# Patient Record
Sex: Female | Born: 1945 | Race: White | Hispanic: No | Marital: Married | Smoking: Former smoker
Health system: Southern US, Community
[De-identification: ages and names within clinical notes are randomized; demographics above are authoritative.]

## PROBLEM LIST (undated history)

## (undated) DIAGNOSIS — G4733 Obstructive sleep apnea (adult) (pediatric): Secondary | ICD-10-CM

## (undated) DIAGNOSIS — F329 Major depressive disorder, single episode, unspecified: Secondary | ICD-10-CM

## (undated) DIAGNOSIS — Z860100 Personal history of colon polyps, unspecified: Secondary | ICD-10-CM

## (undated) DIAGNOSIS — F419 Anxiety disorder, unspecified: Secondary | ICD-10-CM

## (undated) DIAGNOSIS — E785 Hyperlipidemia, unspecified: Secondary | ICD-10-CM

## (undated) DIAGNOSIS — F32A Depression, unspecified: Secondary | ICD-10-CM

## (undated) DIAGNOSIS — R51 Headache: Secondary | ICD-10-CM

## (undated) DIAGNOSIS — G629 Polyneuropathy, unspecified: Secondary | ICD-10-CM

## (undated) DIAGNOSIS — M545 Low back pain: Secondary | ICD-10-CM

## (undated) DIAGNOSIS — N200 Calculus of kidney: Secondary | ICD-10-CM

## (undated) DIAGNOSIS — G8929 Other chronic pain: Secondary | ICD-10-CM

## (undated) DIAGNOSIS — G4700 Insomnia, unspecified: Secondary | ICD-10-CM

## (undated) DIAGNOSIS — J189 Pneumonia, unspecified organism: Secondary | ICD-10-CM

## (undated) DIAGNOSIS — K5900 Constipation, unspecified: Secondary | ICD-10-CM

## (undated) DIAGNOSIS — R519 Headache, unspecified: Secondary | ICD-10-CM

## (undated) DIAGNOSIS — Z9289 Personal history of other medical treatment: Secondary | ICD-10-CM

## (undated) DIAGNOSIS — I10 Essential (primary) hypertension: Secondary | ICD-10-CM

## (undated) DIAGNOSIS — Z9989 Dependence on other enabling machines and devices: Secondary | ICD-10-CM

## (undated) DIAGNOSIS — Z9889 Other specified postprocedural states: Secondary | ICD-10-CM

## (undated) DIAGNOSIS — R350 Frequency of micturition: Secondary | ICD-10-CM

## (undated) DIAGNOSIS — Z8709 Personal history of other diseases of the respiratory system: Secondary | ICD-10-CM

## (undated) DIAGNOSIS — R600 Localized edema: Secondary | ICD-10-CM

## (undated) DIAGNOSIS — C569 Malignant neoplasm of unspecified ovary: Secondary | ICD-10-CM

## (undated) DIAGNOSIS — K219 Gastro-esophageal reflux disease without esophagitis: Secondary | ICD-10-CM

## (undated) DIAGNOSIS — N390 Urinary tract infection, site not specified: Secondary | ICD-10-CM

## (undated) DIAGNOSIS — R609 Edema, unspecified: Secondary | ICD-10-CM

## (undated) DIAGNOSIS — R112 Nausea with vomiting, unspecified: Secondary | ICD-10-CM

## (undated) HISTORY — PX: CATARACT EXTRACTION W/ INTRAOCULAR LENS  IMPLANT, BILATERAL: SHX1307

## (undated) HISTORY — PX: COLONOSCOPY: SHX174

## (undated) HISTORY — PX: DIAGNOSTIC LAPAROSCOPY: SUR761

## (undated) HISTORY — PX: ABDOMINAL HYSTERECTOMY: SHX81

## (undated) HISTORY — PX: THYROID SURGERY: SHX805

---

## 1998-03-04 ENCOUNTER — Ambulatory Visit (HOSPITAL_COMMUNITY): Admission: RE | Admit: 1998-03-04 | Discharge: 1998-03-04 | Payer: Self-pay | Admitting: Internal Medicine

## 1999-03-28 ENCOUNTER — Encounter: Payer: Self-pay | Admitting: Internal Medicine

## 1999-03-28 ENCOUNTER — Ambulatory Visit (HOSPITAL_COMMUNITY): Admission: RE | Admit: 1999-03-28 | Discharge: 1999-03-28 | Payer: Self-pay | Admitting: Internal Medicine

## 1999-07-06 ENCOUNTER — Ambulatory Visit: Admission: RE | Admit: 1999-07-06 | Discharge: 1999-07-06 | Payer: Self-pay | Admitting: Unknown Physician Specialty

## 2000-03-29 ENCOUNTER — Encounter: Payer: Self-pay | Admitting: Internal Medicine

## 2000-03-29 ENCOUNTER — Ambulatory Visit (HOSPITAL_COMMUNITY): Admission: RE | Admit: 2000-03-29 | Discharge: 2000-03-29 | Payer: Self-pay | Admitting: Internal Medicine

## 2001-04-29 ENCOUNTER — Ambulatory Visit (HOSPITAL_COMMUNITY): Admission: RE | Admit: 2001-04-29 | Discharge: 2001-04-29 | Payer: Self-pay | Admitting: Internal Medicine

## 2001-04-29 ENCOUNTER — Encounter: Payer: Self-pay | Admitting: Internal Medicine

## 2001-04-30 ENCOUNTER — Encounter: Admission: RE | Admit: 2001-04-30 | Discharge: 2001-04-30 | Payer: Self-pay | Admitting: Internal Medicine

## 2001-04-30 ENCOUNTER — Encounter: Payer: Self-pay | Admitting: Internal Medicine

## 2002-05-01 ENCOUNTER — Ambulatory Visit (HOSPITAL_COMMUNITY): Admission: RE | Admit: 2002-05-01 | Discharge: 2002-05-01 | Payer: Self-pay | Admitting: Internal Medicine

## 2002-05-01 ENCOUNTER — Encounter: Payer: Self-pay | Admitting: Internal Medicine

## 2015-09-01 DIAGNOSIS — E042 Nontoxic multinodular goiter: Secondary | ICD-10-CM | POA: Insufficient documentation

## 2015-09-01 DIAGNOSIS — I10 Essential (primary) hypertension: Secondary | ICD-10-CM | POA: Insufficient documentation

## 2015-09-01 DIAGNOSIS — E785 Hyperlipidemia, unspecified: Secondary | ICD-10-CM | POA: Insufficient documentation

## 2015-09-01 DIAGNOSIS — M5136 Other intervertebral disc degeneration, lumbar region: Secondary | ICD-10-CM | POA: Insufficient documentation

## 2015-09-01 DIAGNOSIS — F411 Generalized anxiety disorder: Secondary | ICD-10-CM | POA: Insufficient documentation

## 2015-09-01 DIAGNOSIS — Z8543 Personal history of malignant neoplasm of ovary: Secondary | ICD-10-CM | POA: Insufficient documentation

## 2015-10-25 DIAGNOSIS — K219 Gastro-esophageal reflux disease without esophagitis: Secondary | ICD-10-CM | POA: Insufficient documentation

## 2015-10-25 DIAGNOSIS — D34 Benign neoplasm of thyroid gland: Secondary | ICD-10-CM | POA: Insufficient documentation

## 2015-11-01 ENCOUNTER — Other Ambulatory Visit: Payer: Self-pay | Admitting: Otolaryngology

## 2015-11-11 ENCOUNTER — Other Ambulatory Visit: Payer: Self-pay

## 2015-11-11 ENCOUNTER — Encounter (HOSPITAL_COMMUNITY)
Admission: RE | Admit: 2015-11-11 | Discharge: 2015-11-11 | Disposition: A | Discharge status: Home / Self Care | Payer: Medicare Other | Source: Ambulatory Visit | Attending: Otolaryngology | Admitting: Otolaryngology

## 2015-11-11 ENCOUNTER — Encounter (HOSPITAL_COMMUNITY): Payer: Self-pay

## 2015-11-11 ENCOUNTER — Ambulatory Visit (HOSPITAL_COMMUNITY)
Admission: RE | Admit: 2015-11-11 | Discharge: 2015-11-11 | Disposition: A | Discharge status: Home / Self Care | Payer: Medicare Other | Source: Ambulatory Visit | Attending: Anesthesiology | Admitting: Anesthesiology

## 2015-11-11 DIAGNOSIS — R001 Bradycardia, unspecified: Secondary | ICD-10-CM | POA: Insufficient documentation

## 2015-11-11 DIAGNOSIS — E079 Disorder of thyroid, unspecified: Secondary | ICD-10-CM | POA: Insufficient documentation

## 2015-11-11 DIAGNOSIS — Z01812 Encounter for preprocedural laboratory examination: Secondary | ICD-10-CM | POA: Diagnosis not present

## 2015-11-11 DIAGNOSIS — Z01818 Encounter for other preprocedural examination: Secondary | ICD-10-CM | POA: Diagnosis present

## 2015-11-11 DIAGNOSIS — I440 Atrioventricular block, first degree: Secondary | ICD-10-CM | POA: Insufficient documentation

## 2015-11-11 HISTORY — DX: Constipation, unspecified: K59.00

## 2015-11-11 HISTORY — DX: Anxiety disorder, unspecified: F41.9

## 2015-11-11 HISTORY — DX: Insomnia, unspecified: G47.00

## 2015-11-11 HISTORY — DX: Personal history of colon polyps, unspecified: Z86.0100

## 2015-11-11 HISTORY — DX: Urinary tract infection, site not specified: N39.0

## 2015-11-11 HISTORY — DX: Hyperlipidemia, unspecified: E78.5

## 2015-11-11 HISTORY — DX: Frequency of micturition: R35.0

## 2015-11-11 HISTORY — DX: Gastro-esophageal reflux disease without esophagitis: K21.9

## 2015-11-11 HISTORY — DX: Polyneuropathy, unspecified: G62.9

## 2015-11-11 HISTORY — DX: Personal history of other diseases of the respiratory system: Z87.09

## 2015-11-11 HISTORY — DX: Essential (primary) hypertension: I10

## 2015-11-11 HISTORY — DX: Personal history of other medical treatment: Z92.89

## 2015-11-11 HISTORY — DX: Nausea with vomiting, unspecified: Z98.890

## 2015-11-11 HISTORY — DX: Pneumonia, unspecified organism: J18.9

## 2015-11-11 HISTORY — DX: Personal history of colonic polyps: Z86.010

## 2015-11-11 HISTORY — DX: Edema, unspecified: R60.9

## 2015-11-11 HISTORY — DX: Depression, unspecified: F32.A

## 2015-11-11 HISTORY — DX: Major depressive disorder, single episode, unspecified: F32.9

## 2015-11-11 HISTORY — DX: Other specified postprocedural states: R11.2

## 2015-11-11 HISTORY — DX: Localized edema: R60.0

## 2015-11-11 LAB — CBC
HCT: 41.3 % (ref 36.0–46.0)
Hemoglobin: 13.7 g/dL (ref 12.0–15.0)
MCH: 29.3 pg (ref 26.0–34.0)
MCHC: 33.2 g/dL (ref 30.0–36.0)
MCV: 88.2 fL (ref 78.0–100.0)
PLATELETS: 175 10*3/uL (ref 150–400)
RBC: 4.68 MIL/uL (ref 3.87–5.11)
RDW: 13 % (ref 11.5–15.5)
WBC: 8.6 10*3/uL (ref 4.0–10.5)

## 2015-11-11 LAB — BASIC METABOLIC PANEL
Anion gap: 8 (ref 5–15)
BUN: 17 mg/dL (ref 6–20)
CO2: 25 mmol/L (ref 22–32)
CREATININE: 0.87 mg/dL (ref 0.44–1.00)
Calcium: 10.6 mg/dL — ABNORMAL HIGH (ref 8.9–10.3)
Chloride: 106 mmol/L (ref 101–111)
GFR calc Af Amer: >60 mL/min (ref >60)
GLUCOSE: 82 mg/dL (ref 65–99)
Potassium: 4.2 mmol/L (ref 3.5–5.1)
SODIUM: 139 mmol/L (ref 135–145)

## 2015-11-11 NOTE — Progress Notes (Addendum)
Cardiologist denies  Medical Md is Dr.Rebecca Suzy Bouchard  Echo around 2002  Stress test prior to 2002  Heart cath denies  EKG denies in past yr  CXR denies in past yr

## 2015-11-11 NOTE — Pre-Procedure Instructions (Signed)
Mary Vega  11/11/2015     No Pharmacies Listed   Your procedure is scheduled on Thurs, Mar 16 @ 7:30 AM  Report to [facility] Admitting at 5:30 AM  Call this number if you have problems the morning of surgery:  [phone]   Remember:  Do not eat food or drink liquids after midnight.  Take these medicines the morning of surgery with A SIP OF WATER: Omeprazole(Prilosec),Lorazepam(Ativan),and Sertraline(Zoloft)             No Goody's,BC's,Aleve,Aspirin,Ibuprofen,Motrin,Advil,Fish Oil,or any Herbal Medications.    Do not wear jewelry, make-up or nail polish.  Do not wear lotions, powders, or perfumes.  You may wear deodorant.  Do not shave 48 hours prior to surgery.    Do not bring valuables to the hospital.  [facility] is not responsible for any belongings or valuables.  Contacts, dentures or bridgework may not be worn into surgery.  Leave your suitcase in the car.  After surgery it may be brought to your room.  For patients admitted to the hospital, discharge time will be determined by your treatment team.  Patients discharged the day of surgery will not be allowed to drive home.    Special instructions:  [place] - Preparing for Surgery  Before surgery, you can play an important role.  Because skin is not sterile, your skin needs to be as free of germs as possible.  You can reduce the number of germs on you skin by washing with CHG (chlorahexidine gluconate) soap before surgery.  CHG is an antiseptic cleaner which kills germs and bonds with the skin to continue killing germs even after washing.  Please DO NOT use if you have an allergy to CHG or antibacterial soaps.  If your skin becomes reddened/irritated stop using the CHG and inform your nurse when you arrive at Short Stay.  Do not shave (including legs and underarms) for at least 48 hours prior to the first CHG shower.  You may shave your face.  Please follow these instructions carefully:   1.   Shower with CHG Soap the night before surgery and the                                morning of Surgery.  2.  If you choose to wash your hair, wash your hair first as usual with your       normal shampoo.  3.  After you shampoo, rinse your hair and body thoroughly to remove the                      Shampoo.  4.  Use CHG as you would any other liquid soap.  You can apply chg directly       to the skin and wash gently with scrungie or a clean washcloth.  5.  Apply the CHG Soap to your body ONLY FROM THE NECK DOWN.        Do not use on open wounds or open sores.  Avoid contact with your eyes,       ears, mouth and genitals (private parts).  Wash genitals (private parts)       with your normal soap.  6.  Wash thoroughly, paying special attention to the area where your surgery        will be performed.  7.  Thoroughly rinse your body with warm  water from the neck down.  8.  DO NOT shower/wash with your normal soap after using and rinsing off       the CHG Soap.  9.  Pat yourself dry with a clean towel.            10.  Wear clean pajamas.            11.  Place clean sheets on your bed the night of your first shower and do not        sleep with pets.  Day of Surgery  Do not apply any lotions/deoderants the morning of surgery.  Please wear clean clothes to the hospital/surgery center.    Please read over the following fact sheets that you were given. Pain Booklet, Coughing and Deep Breathing and Surgical Site Infection Prevention

## 2015-11-16 NOTE — Anesthesia Preprocedure Evaluation (Addendum)
Anesthesia Evaluation  Patient identified by MRN, date of birth, ID band Patient awake    Reviewed: Allergy & Precautions, NPO status , Patient's Chart, lab work & pertinent test results  History of Anesthesia Complications Negative for: history of anesthetic complications  Airway Mallampati: II  TM Distance: >3 FB Neck ROM: Full    Dental  (+) Edentulous Upper, Dental Advisory Given   Pulmonary former smoker,    breath sounds clear to auscultation       Cardiovascular hypertension, Pt. on medications (-) angina Rhythm:Regular Rate:Normal     Neuro/Psych  Headaches, Anxiety Depression    GI/Hepatic Neg liver ROS, GERD  Medicated and Controlled,  Endo/Other  Thyroid mass  Renal/GU negative Renal ROS     Musculoskeletal   Abdominal   Peds  Hematology negative hematology ROS (+)   Anesthesia Other Findings   Reproductive/Obstetrics H/o ovarian cancer: chemo                           Anesthesia Physical Anesthesia Plan  ASA: III  Anesthesia Plan: General   Post-op Pain Management:    Induction: Intravenous  Airway Management Planned: Oral ETT  Additional Equipment:   Intra-op Plan:   Post-operative Plan: Extubation in OR  Informed Consent: I have reviewed the patients History and Physical, chart, labs and discussed the procedure including the risks, benefits and alternatives for the proposed anesthesia with the patient or authorized representative who has indicated his/her understanding and acceptance.   Dental advisory given  Plan Discussed with: CRNA and Surgeon  Anesthesia Plan Comments: (Plan routine monitors, GETA)        Anesthesia Quick Evaluation

## 2015-11-17 ENCOUNTER — Ambulatory Visit (HOSPITAL_COMMUNITY): Payer: Medicare Other | Admitting: Anesthesiology

## 2015-11-17 ENCOUNTER — Observation Stay (HOSPITAL_COMMUNITY)
Admission: RE | Admit: 2015-11-17 | Discharge: 2015-11-19 | Disposition: A | Discharge status: Home / Self Care | Payer: Medicare Other | Source: Ambulatory Visit | Attending: Otolaryngology | Admitting: Otolaryngology

## 2015-11-17 ENCOUNTER — Encounter (HOSPITAL_COMMUNITY)
Admission: RE | Disposition: A | Discharge status: Home / Self Care | Payer: Self-pay | Source: Ambulatory Visit | Attending: Otolaryngology

## 2015-11-17 ENCOUNTER — Encounter (HOSPITAL_COMMUNITY): Payer: Self-pay | Admitting: *Deleted

## 2015-11-17 DIAGNOSIS — Z8543 Personal history of malignant neoplasm of ovary: Secondary | ICD-10-CM | POA: Insufficient documentation

## 2015-11-17 DIAGNOSIS — Z87891 Personal history of nicotine dependence: Secondary | ICD-10-CM | POA: Insufficient documentation

## 2015-11-17 DIAGNOSIS — R600 Localized edema: Secondary | ICD-10-CM | POA: Diagnosis not present

## 2015-11-17 DIAGNOSIS — F419 Anxiety disorder, unspecified: Secondary | ICD-10-CM | POA: Diagnosis not present

## 2015-11-17 DIAGNOSIS — E0789 Other specified disorders of thyroid: Secondary | ICD-10-CM | POA: Diagnosis present

## 2015-11-17 DIAGNOSIS — D34 Benign neoplasm of thyroid gland: Principal | ICD-10-CM | POA: Insufficient documentation

## 2015-11-17 DIAGNOSIS — Z79899 Other long term (current) drug therapy: Secondary | ICD-10-CM | POA: Diagnosis not present

## 2015-11-17 DIAGNOSIS — K5900 Constipation, unspecified: Secondary | ICD-10-CM | POA: Diagnosis not present

## 2015-11-17 DIAGNOSIS — G4700 Insomnia, unspecified: Secondary | ICD-10-CM | POA: Insufficient documentation

## 2015-11-17 DIAGNOSIS — K219 Gastro-esophageal reflux disease without esophagitis: Secondary | ICD-10-CM | POA: Diagnosis not present

## 2015-11-17 DIAGNOSIS — F329 Major depressive disorder, single episode, unspecified: Secondary | ICD-10-CM | POA: Insufficient documentation

## 2015-11-17 DIAGNOSIS — G8929 Other chronic pain: Secondary | ICD-10-CM | POA: Insufficient documentation

## 2015-11-17 DIAGNOSIS — E785 Hyperlipidemia, unspecified: Secondary | ICD-10-CM | POA: Insufficient documentation

## 2015-11-17 DIAGNOSIS — E079 Disorder of thyroid, unspecified: Secondary | ICD-10-CM | POA: Diagnosis present

## 2015-11-17 DIAGNOSIS — I10 Essential (primary) hypertension: Secondary | ICD-10-CM | POA: Diagnosis not present

## 2015-11-17 HISTORY — DX: Headache, unspecified: R51.9

## 2015-11-17 HISTORY — DX: Malignant neoplasm of unspecified ovary: C56.9

## 2015-11-17 HISTORY — DX: Calculus of kidney: N20.0

## 2015-11-17 HISTORY — DX: Obstructive sleep apnea (adult) (pediatric): G47.33

## 2015-11-17 HISTORY — DX: Low back pain: M54.5

## 2015-11-17 HISTORY — PX: THYROIDECTOMY: SHX17

## 2015-11-17 HISTORY — DX: Dependence on other enabling machines and devices: Z99.89

## 2015-11-17 HISTORY — PX: THYROID LOBECTOMY: SHX420

## 2015-11-17 HISTORY — DX: Other chronic pain: G89.29

## 2015-11-17 HISTORY — DX: Headache: R51

## 2015-11-17 LAB — CBC
HEMATOCRIT: 39.8 % (ref 36.0–46.0)
HEMOGLOBIN: 13.6 g/dL (ref 12.0–15.0)
MCH: 30.3 pg (ref 26.0–34.0)
MCHC: 34.2 g/dL (ref 30.0–36.0)
MCV: 88.6 fL (ref 78.0–100.0)
Platelets: 176 10*3/uL (ref 150–400)
RBC: 4.49 MIL/uL (ref 3.87–5.11)
RDW: 13 % (ref 11.5–15.5)
WBC: 19.6 10*3/uL — ABNORMAL HIGH (ref 4.0–10.5)

## 2015-11-17 LAB — CREATININE, SERUM
Creatinine, Ser: 0.94 mg/dL (ref 0.44–1.00)
GFR calc Af Amer: >60 mL/min (ref >60)
GFR calc non Af Amer: >60 mL/min (ref >60)

## 2015-11-17 SURGERY — THYROIDECTOMY
Anesthesia: General | Site: Neck | Laterality: Right

## 2015-11-17 MED ORDER — HEPARIN SODIUM (PORCINE) 5000 UNIT/ML IJ SOLN
5000.0000 [IU] | Freq: Three times a day (TID) | INTRAMUSCULAR | Status: DC
  Administered 2015-11-17 – 2015-11-19 (×5): 5000 [IU] via SUBCUTANEOUS
  Filled 2015-11-17 (×5): qty 1

## 2015-11-17 MED ORDER — ONDANSETRON HCL 4 MG/2ML IJ SOLN
4.0000 mg | Freq: Four times a day (QID) | INTRAMUSCULAR | Status: DC | PRN
  Administered 2015-11-17 – 2015-11-19 (×5): 4 mg via INTRAVENOUS
  Filled 2015-11-17 (×5): qty 2

## 2015-11-17 MED ORDER — LIDOCAINE-EPINEPHRINE 1 %-1:100000 IJ SOLN
INTRAMUSCULAR | Status: AC
  Filled 2015-11-17: qty 1

## 2015-11-17 MED ORDER — DOCUSATE SODIUM 100 MG PO CAPS
100.0000 mg | ORAL_CAPSULE | Freq: Every day | ORAL | Status: DC | PRN
  Administered 2015-11-18: 100 mg via ORAL
  Filled 2015-11-17: qty 1

## 2015-11-17 MED ORDER — MIDAZOLAM HCL 2 MG/2ML IJ SOLN: 0.5000 mg | Freq: Once | INTRAMUSCULAR | Status: DC | PRN

## 2015-11-17 MED ORDER — LACTATED RINGERS IV SOLN
INTRAVENOUS | Status: DC | PRN
  Administered 2015-11-17 (×2): via INTRAVENOUS

## 2015-11-17 MED ORDER — SUCCINYLCHOLINE 20MG/ML (10ML) SYRINGE FOR MEDFUSION PUMP - OPTIME
INTRAMUSCULAR | Status: DC | PRN
  Administered 2015-11-17: 100 mg via INTRAVENOUS

## 2015-11-17 MED ORDER — LORAZEPAM 1 MG PO TABS
1.0000 mg | ORAL_TABLET | Freq: Four times a day (QID) | ORAL | Status: DC | PRN
Start: 2015-11-17 — End: 2015-11-19
  Administered 2015-11-18 – 2015-11-19 (×4): 1 mg via ORAL
  Filled 2015-11-17 (×4): qty 1

## 2015-11-17 MED ORDER — PHENYLEPHRINE 40 MCG/ML (10ML) SYRINGE FOR IV PUSH (FOR BLOOD PRESSURE SUPPORT)
PREFILLED_SYRINGE | INTRAVENOUS | Status: AC
  Filled 2015-11-17: qty 10

## 2015-11-17 MED ORDER — MEPERIDINE HCL 25 MG/ML IJ SOLN: 6.2500 mg | INTRAMUSCULAR | Status: DC | PRN

## 2015-11-17 MED ORDER — SODIUM CHLORIDE 0.9 % IJ SOLN
INTRAMUSCULAR | Status: AC
  Filled 2015-11-17: qty 10

## 2015-11-17 MED ORDER — ONDANSETRON HCL 4 MG/2ML IJ SOLN
INTRAMUSCULAR | Status: DC | PRN
  Administered 2015-11-17: 4 mg via INTRAVENOUS

## 2015-11-17 MED ORDER — 0.9 % SODIUM CHLORIDE (POUR BTL) OPTIME
TOPICAL | Status: DC | PRN
  Administered 2015-11-17: 1000 mL

## 2015-11-17 MED ORDER — FENTANYL CITRATE (PF) 250 MCG/5ML IJ SOLN
INTRAMUSCULAR | Status: AC
  Filled 2015-11-17: qty 5

## 2015-11-17 MED ORDER — DEXTROSE-NACL 5-0.45 % IV SOLN
INTRAVENOUS | Status: DC
  Administered 2015-11-17 – 2015-11-18 (×4): via INTRAVENOUS

## 2015-11-17 MED ORDER — SPIRONOLACTONE 25 MG PO TABS
25.0000 mg | ORAL_TABLET | Freq: Every day | ORAL | Status: DC
  Administered 2015-11-18 – 2015-11-19 (×2): 25 mg via ORAL
  Filled 2015-11-17 (×2): qty 1

## 2015-11-17 MED ORDER — LOSARTAN POTASSIUM 50 MG PO TABS
100.0000 mg | ORAL_TABLET | Freq: Every day | ORAL | Status: DC
  Administered 2015-11-17 – 2015-11-18 (×2): 100 mg via ORAL
  Filled 2015-11-17 (×2): qty 2

## 2015-11-17 MED ORDER — LIDOCAINE HCL (CARDIAC) 20 MG/ML IV SOLN
INTRAVENOUS | Status: AC
  Filled 2015-11-17: qty 5

## 2015-11-17 MED ORDER — PROPOFOL 10 MG/ML IV BOLUS
INTRAVENOUS | Status: AC
  Filled 2015-11-17: qty 40

## 2015-11-17 MED ORDER — TRAZODONE HCL 100 MG PO TABS
200.0000 mg | ORAL_TABLET | Freq: Every day | ORAL | Status: DC
  Administered 2015-11-17 – 2015-11-18 (×2): 200 mg via ORAL
  Filled 2015-11-17 (×2): qty 2

## 2015-11-17 MED ORDER — ONDANSETRON 4 MG PO TBDP: 4.0000 mg | ORAL_TABLET | Freq: Four times a day (QID) | ORAL | Status: DC | PRN

## 2015-11-17 MED ORDER — HEMOSTATIC AGENTS (NO CHARGE) OPTIME
TOPICAL | Status: DC | PRN
  Administered 2015-11-17: 1 via TOPICAL

## 2015-11-17 MED ORDER — MIDAZOLAM HCL 2 MG/2ML IJ SOLN
INTRAMUSCULAR | Status: AC
  Filled 2015-11-17: qty 2

## 2015-11-17 MED ORDER — SERTRALINE HCL 100 MG PO TABS
100.0000 mg | ORAL_TABLET | Freq: Every day | ORAL | Status: DC
  Administered 2015-11-18 – 2015-11-19 (×2): 100 mg via ORAL
  Filled 2015-11-17 (×2): qty 1

## 2015-11-17 MED ORDER — HYDROMORPHONE HCL 1 MG/ML IJ SOLN
INTRAMUSCULAR | Status: AC
  Filled 2015-11-17: qty 1

## 2015-11-17 MED ORDER — PANTOPRAZOLE SODIUM 40 MG PO TBEC
40.0000 mg | DELAYED_RELEASE_TABLET | Freq: Every day | ORAL | Status: DC
  Administered 2015-11-18 – 2015-11-19 (×2): 40 mg via ORAL
  Filled 2015-11-17 (×2): qty 1

## 2015-11-17 MED ORDER — ARTIFICIAL TEARS OP OINT
TOPICAL_OINTMENT | OPHTHALMIC | Status: DC | PRN
  Administered 2015-11-17: 1 via OPHTHALMIC

## 2015-11-17 MED ORDER — MIDAZOLAM HCL 5 MG/5ML IJ SOLN
INTRAMUSCULAR | Status: DC | PRN
  Administered 2015-11-17 (×2): 1 mg via INTRAVENOUS

## 2015-11-17 MED ORDER — PROPOFOL 500 MG/50ML IV EMUL
INTRAVENOUS | Status: DC | PRN
  Administered 2015-11-17: 100 ug/kg/min via INTRAVENOUS

## 2015-11-17 MED ORDER — HYDROMORPHONE HCL 1 MG/ML IJ SOLN
0.2500 mg | INTRAMUSCULAR | Status: DC | PRN
  Administered 2015-11-17 (×2): 0.5 mg via INTRAVENOUS

## 2015-11-17 MED ORDER — NALOXONE HCL 0.4 MG/ML IJ SOLN
INTRAMUSCULAR | Status: AC
  Filled 2015-11-17: qty 1

## 2015-11-17 MED ORDER — SUCCINYLCHOLINE CHLORIDE 20 MG/ML IJ SOLN
INTRAMUSCULAR | Status: AC
  Filled 2015-11-17: qty 1

## 2015-11-17 MED ORDER — ARTIFICIAL TEARS OP OINT
TOPICAL_OINTMENT | OPHTHALMIC | Status: AC
  Filled 2015-11-17: qty 3.5

## 2015-11-17 MED ORDER — PROMETHAZINE HCL 25 MG/ML IJ SOLN: 6.2500 mg | INTRAMUSCULAR | Status: DC | PRN

## 2015-11-17 MED ORDER — SIMVASTATIN 40 MG PO TABS
40.0000 mg | ORAL_TABLET | Freq: Every day | ORAL | Status: DC
  Administered 2015-11-17 – 2015-11-18 (×2): 40 mg via ORAL
  Filled 2015-11-17 (×2): qty 1

## 2015-11-17 MED ORDER — EPHEDRINE SULFATE 50 MG/ML IJ SOLN
INTRAMUSCULAR | Status: AC
  Filled 2015-11-17: qty 1

## 2015-11-17 MED ORDER — HYDROCODONE-ACETAMINOPHEN 5-325 MG PO TABS
1.0000 | ORAL_TABLET | ORAL | Status: DC | PRN
  Administered 2015-11-17: 1 via ORAL
  Administered 2015-11-18 (×2): 2 via ORAL
  Administered 2015-11-18: 1 via ORAL
  Administered 2015-11-18: 2 via ORAL
  Filled 2015-11-17 (×2): qty 2
  Filled 2015-11-17: qty 1
  Filled 2015-11-17 (×2): qty 2

## 2015-11-17 MED ORDER — LIDOCAINE HCL (CARDIAC) 20 MG/ML IV SOLN
INTRAVENOUS | Status: DC | PRN
  Administered 2015-11-17: 20 mg via INTRAVENOUS
  Administered 2015-11-17: 80 mg via INTRAVENOUS

## 2015-11-17 MED ORDER — OMEGA-3-ACID ETHYL ESTERS 1 G PO CAPS
1.0000 g | ORAL_CAPSULE | Freq: Every day | ORAL | Status: DC
  Administered 2015-11-18 – 2015-11-19 (×2): 1 g via ORAL
  Filled 2015-11-17 (×2): qty 1

## 2015-11-17 MED ORDER — PHENYLEPHRINE HCL 10 MG/ML IJ SOLN
INTRAMUSCULAR | Status: DC | PRN
  Administered 2015-11-17: 80 ug via INTRAVENOUS

## 2015-11-17 MED ORDER — PROPOFOL 10 MG/ML IV BOLUS
INTRAVENOUS | Status: DC | PRN
  Administered 2015-11-17 (×2): 50 mg via INTRAVENOUS
  Administered 2015-11-17: 150 mg via INTRAVENOUS

## 2015-11-17 MED ORDER — GLYCOPYRROLATE 0.2 MG/ML IJ SOLN
INTRAMUSCULAR | Status: DC | PRN
  Administered 2015-11-17: 0.2 mg via INTRAVENOUS

## 2015-11-17 MED ORDER — ONDANSETRON HCL 4 MG/2ML IJ SOLN
INTRAMUSCULAR | Status: AC
  Filled 2015-11-17: qty 2

## 2015-11-17 MED ORDER — FENTANYL CITRATE (PF) 100 MCG/2ML IJ SOLN
INTRAMUSCULAR | Status: DC | PRN
  Administered 2015-11-17: 100 ug via INTRAVENOUS
  Administered 2015-11-17: 150 ug via INTRAVENOUS

## 2015-11-17 SURGICAL SUPPLY — 52 items
APPLIER CLIP 9.375 SM OPEN (CLIP) ×3
APR CLP SM 9.3 20 MLT OPN (CLIP) ×1
ATTRACTOMAT 16X20 MAGNETIC DRP (DRAPES) IMPLANT
BLADE SURG 15 STRL LF DISP TIS (BLADE) IMPLANT
BLADE SURG 15 STRL SS (BLADE)
BLADE SURG ROTATE 9660 (MISCELLANEOUS) IMPLANT
CANISTER SUCTION 2500CC (MISCELLANEOUS) ×3 IMPLANT
CLEANER TIP ELECTROSURG 2X2 (MISCELLANEOUS) ×3 IMPLANT
CLIP APPLIE 9.375 SM OPEN (CLIP) ×1 IMPLANT
CONT SPEC 4OZ CLIKSEAL STRL BL (MISCELLANEOUS) ×4 IMPLANT
CORDS BIPOLAR (ELECTRODE) ×3 IMPLANT
COVER SURGICAL LIGHT HANDLE (MISCELLANEOUS) ×3 IMPLANT
CRADLE DONUT ADULT HEAD (MISCELLANEOUS) ×2 IMPLANT
DRAIN JACKSON RD 7FR 3/32 (WOUND CARE) ×3 IMPLANT
DRAPE PROXIMA HALF (DRAPES) ×3 IMPLANT
ELECT COATED BLADE 2.86 ST (ELECTRODE) ×3 IMPLANT
ELECT REM PT RETURN 9FT ADLT (ELECTROSURGICAL) ×3
ELECTRODE REM PT RTRN 9FT ADLT (ELECTROSURGICAL) ×1 IMPLANT
EVACUATOR SILICONE 100CC (DRAIN) ×3 IMPLANT
GAUZE SPONGE 4X4 16PLY XRAY LF (GAUZE/BANDAGES/DRESSINGS) ×3 IMPLANT
GLOVE BIO SURGEON STRL SZ 6.5 (GLOVE) ×2 IMPLANT
GLOVE BIO SURGEONS STRL SZ 6.5 (GLOVE) ×2
GLOVE BIOGEL PI IND STRL 7.0 (GLOVE) IMPLANT
GLOVE BIOGEL PI INDICATOR 7.0 (GLOVE) ×4
GLOVE ECLIPSE 7.5 STRL STRAW (GLOVE) ×3 IMPLANT
GLOVE SURG SS PI 7.0 STRL IVOR (GLOVE) ×4 IMPLANT
GOWN STRL REUS W/ TWL LRG LVL3 (GOWN DISPOSABLE) ×2 IMPLANT
GOWN STRL REUS W/TWL LRG LVL3 (GOWN DISPOSABLE) ×12
HEMOSTAT SURGICEL 2X4 FIBR (HEMOSTASIS) ×2 IMPLANT
KIT BASIN OR (CUSTOM PROCEDURE TRAY) ×3 IMPLANT
KIT ROOM TURNOVER OR (KITS) ×3 IMPLANT
LIQUID BAND (GAUZE/BANDAGES/DRESSINGS) ×2 IMPLANT
LOCATOR NERVE 3 VOLT (DISPOSABLE) IMPLANT
NDL HYPO 25GX1X1/2 BEV (NEEDLE) IMPLANT
NEEDLE HYPO 25GX1X1/2 BEV (NEEDLE) IMPLANT
NS IRRIG 1000ML POUR BTL (IV SOLUTION) ×3 IMPLANT
PAD ARMBOARD 7.5X6 YLW CONV (MISCELLANEOUS) ×4 IMPLANT
PENCIL FOOT CONTROL (ELECTRODE) ×3 IMPLANT
PROBE NERVBE PRASS .33 (MISCELLANEOUS) ×3 IMPLANT
SHEARS HARMONIC 9CM CVD (BLADE) ×3 IMPLANT
SPONGE INTESTINAL PEANUT (DISPOSABLE) IMPLANT
STAPLER VISISTAT 35W (STAPLE) ×3 IMPLANT
SUT CHROMIC 4 0 PS 2 18 (SUTURE) ×8 IMPLANT
SUT ETHILON 3 0 PS 1 (SUTURE) ×3 IMPLANT
SUT SILK 2 0 (SUTURE) ×3
SUT SILK 2-0 18XBRD TIE 12 (SUTURE) ×1 IMPLANT
SUT SILK 4 0 (SUTURE)
SUT SILK 4-0 18XBRD TIE 12 (SUTURE) IMPLANT
TOWEL OR 17X24 6PK STRL BLUE (TOWEL DISPOSABLE) ×3 IMPLANT
TRAY ENT MC OR (CUSTOM PROCEDURE TRAY) ×3 IMPLANT
TRAY FOLEY CATH 16FR SILVER (SET/KITS/TRAYS/PACK) IMPLANT
TUBE ENDOTRAC EMG 7X10.2 (MISCELLANEOUS) ×2 IMPLANT

## 2015-11-17 NOTE — Anesthesia Procedure Notes (Signed)
Procedure Name: Intubation Date/Time: 11/17/2015 7:42 AM Performed by: Scheryl Darter Pre-anesthesia Checklist: Patient identified, Emergency Drugs available, Suction available, Patient being monitored and Timeout performed Patient Re-evaluated:Patient Re-evaluated prior to inductionOxygen Delivery Method: Circle system utilized Preoxygenation: Pre-oxygenation with 100% oxygen Intubation Type: IV induction Ventilation: Mask ventilation without difficulty Laryngoscope Size: Miller and 2 Grade View: Grade III Tube type: Oral (nims ett) Tube size: 7.0 mm Number of attempts: 1 Airway Equipment and Method: Stylet Placement Confirmation: ETT inserted through vocal cords under direct vision,  positive ETCO2 and breath sounds checked- equal and bilateral Tube secured with: Tape Dental Injury: Teeth and Oropharynx as per pre-operative assessment  Difficulty Due To: Difficulty was anticipated

## 2015-11-17 NOTE — H&P (Signed)
Mary Vega is an 70 y.o. female.   Chief Complaint: thyroid mass HPI: hx of thyroid surgery and now with right sided mass in the thyroid by [place].   Past Medical History  Diagnosis Date  . GERD (gastroesophageal reflux disease)     takes Omeprazole daily  . Hypertension     takes Losartan daily   . UTI (lower urinary tract infection)     being treated with Macrobid,completed today 11/11/15  . Insomnia     takes Trazodone nightly  . Depression     takes Zoloft daily  . Anxiety     takes Lorazepam daily  . Hyperlipidemia     takes Simvastatin daily  . Peripheral edema     takes Spironolactone daily  . Constipation     takes Stool Softener daily as needed  . PONV (postoperative nausea and vomiting)   . Pneumonia 5 yrs ago  . History of bronchitis     Sept 2016  . Headache     sinus  . Peripheral neuropathy ([place])   . Cancer ([place])     ovarian  . Chronic back pain     buldging disc  . History of colon polyps     benign  . Urinary frequency   . History of blood transfusion     no abnormal reaction noted    Past Surgical History  Procedure Laterality Date  . Abdominal hysterectomy    . Thyroid surgery      x 2(goiter removed)  . Diagnostic laparoscopy      to drain cyst on ovary  . Colonoscopy    . Cataract surgery Bilateral     History reviewed. No pertinent family history. Social History:  reports that she has quit smoking. She has never used smokeless tobacco. She reports that she does not drink alcohol or use illicit drugs.  Allergies:  Allergies  Allergen Reactions  . Iodine-131 Anaphylaxis and Itching  . Ivp Dye [Iodinated Diagnostic Agents] Anaphylaxis  . Sulfa Antibiotics Hives and Other (See Comments)    Septic?  . Sulfamethoxazole Hives and Rash  . Clams [Shellfish Allergy] Diarrhea and Nausea And Vomiting  . Phenobarbital Other (See Comments)    Tremor (intolerance)    Medications Prior to Admission  Medication Sig Dispense Refill  . docusate  sodium (COLACE) 100 MG capsule Take 100 mg by mouth daily as needed for mild constipation.    Marland Kitchen LORazepam (ATIVAN) 1 MG tablet Take 1 mg by mouth every 6 (six) hours as needed for anxiety.    Marland Kitchen losartan (COZAAR) 100 MG tablet Take 100 mg by mouth daily.    . Multiple Vitamin (MULTIVITAMIN WITH MINERALS) TABS tablet Take 1 tablet by mouth daily.    Marland Kitchen omega-3 acid ethyl esters (LOVAZA) 1 g capsule Take 1 g by mouth daily.    Marland Kitchen omeprazole (PRILOSEC) 40 MG capsule Take 40 mg by mouth daily.    . sertraline (ZOLOFT) 100 MG tablet Take 100 mg by mouth daily.    . simvastatin (ZOCOR) 40 MG tablet Take 40 mg by mouth daily.    Marland Kitchen spironolactone (ALDACTONE) 25 MG tablet Take 25 mg by mouth daily.    . traZODone (DESYREL) 100 MG tablet Take 200 mg by mouth at bedtime.      No results found for this or any previous visit (from the past 48 hour(s)). No results found.  Review of Systems  Constitutional: Negative.   HENT: Negative.   Eyes: Negative.  Respiratory: Negative.   Cardiovascular: Negative.   Skin: Negative.     Blood pressure 119/66, pulse 56, temperature 97.8 F (36.6 C), temperature source Oral, resp. rate 18, weight 81.599 kg (179 lb 14.3 oz), SpO2 96 %. Physical Exam  Constitutional: She appears well-developed and well-nourished.  HENT:  Head: Normocephalic and atraumatic.  Nose: Nose normal.  Mouth/Throat: Oropharynx is clear and moist.  Eyes: Conjunctivae are normal. Pupils are equal, round, and reactive to light.  Neck: Normal range of motion. Neck supple.  Cardiovascular: Normal rate.   Respiratory: Effort normal.  GI: Soft.  Musculoskeletal: Normal range of motion.     Assessment/Plan Thyroid mass- discussed the -procedure and she is ready to proceed  Melissa Montane, MD 11/17/2015, 7:08 AM

## 2015-11-17 NOTE — Progress Notes (Signed)
Dr Jenita Seashore fully updated. No new orders

## 2015-11-17 NOTE — Progress Notes (Signed)
After additional 0.5mg  IV Dilaudid (total 1 mg), pt very sleepy with O2 sats 85-87 on [place]. Put back on 7L simple mask. She is easily arousable, uses IS up to 1000cc, but goes right back to sleep when left alone. Will cont to monitor.

## 2015-11-17 NOTE — Transfer of Care (Signed)
Immediate Anesthesia Transfer of Care Note  Patient: Mary Vega  Procedure(s) Performed: Procedure(s): RIGHT THYROID LOBECTOMY WITH FROZEN SECTION (Right)  Patient Location: PACU  Anesthesia Type:General  Level of Consciousness: awake, alert , oriented and sedated  Airway & Oxygen Therapy: Patient Spontanous Breathing and Patient connected to face mask oxygen  Post-op Assessment: Report given to RN, Post -op Vital signs reviewed and stable and Patient moving all extremities  Post vital signs: Reviewed and stable  Last Vitals:  Filed Vitals:   11/17/15 0618  BP: 119/66  Pulse: 56  Temp: 36.6 C  Resp: 18    Complications: No apparent anesthesia complications

## 2015-11-17 NOTE — Progress Notes (Signed)
Pt increasingly somonulent and difficult to arouse, O2 sat 95% ETCO2 63. Nasal airway placed.  Dr Glennon Mac paged and at bedside. Pt put on CPAP 8, FIO2 40 % by RT. She became more awake,responsive.  Will cont to monitor closely.

## 2015-11-17 NOTE — Op Note (Signed)
Re-op/postop diagnosis: Thyroid mass right side Procedure: Right thyroidectomy with Nims monitor Anesthesia: Gen. Estimated blood loss: Less than 25 mL Indications: 70 year old who's had previous thyroidectomy surgery 2 in other states. It's unclear what the diagnosis is were for these operations. 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 She recently had a thyroid ultrasound performed which showed thyroid glands on both sides and and isthmus despite this surgical intervention. There was a 2.5 cm mass in the right gland which she wants to have removed. We discussed the procedure. We discussed risks, benefits, and options. All her questions are answered and consent was obtained. Procedure: Patient was taken to the operating room placed in the supine position after general endotracheal tube anesthesia with the Nims to monitor was prepped and draped in the usual sterile manner. The previous incision was used opening with the electrocautery. A superior and inferior subplatysmal flap was elevated. The retaining retractor was positioned and the midline was divided. The strap muscles were then defined and divided at their diastases. Immediately the isthmus of the thyroid was identified. Dissection was carried along the capsule of this thyroid gland which was scarred. Dissection was carefully brought lateral around the gland staying on the capsule. Inferiorly the what appeared to be the parathyroid gland was identified and dissected off the gland. Once this was dissected careful dissection was performed in this location and the nerve was identified. It was followed up into the insertion into the larynx. Superior pole was dissected along the capsule. There was several nodules identified within the thyroid gland. Berry's ligament was then divided after done a fine the insertion of the nerve. The gland was brought up to the isthmus and the isthmus was divided with electrocautery. The wound was irrigated with saline.  The nerve was stimulated multiple times to the surgery with the Nims monitor indicating good action potentials. At the end of the case this was also performed again with good action potentials. The wound was irrigated again and a drain was placed brought out the left lateral skin the. The strap muscles were sutured in the midline loosely with multiple interrupted chromics. The subcutaneous tissue was closed with interrupted 4-0 chromic and the drain was secured with 3-0 nylon. The frozen section was a follicular lesion but no evidence of malignancy. Dermabond was placed on the skin and the patient was awakened brought to recovery room in stable condition counts correct

## 2015-11-17 NOTE — Progress Notes (Signed)
Dr Glennon Mac fully updated-OK to tx pt to 6N.

## 2015-11-17 NOTE — Progress Notes (Signed)
Patient ID: Mary Vega, female   DOB: Oct 09, 1945, 70 y.o.   MRN: [id number]  Awake and alert. Complains of sore throat and has some nausea and vomiting earlier.   Breathing clearly, voice is normal. Neck incision excellent. JP holding a seal.  Stable post op. Continue overnight observation.

## 2015-11-17 NOTE — Anesthesia Postprocedure Evaluation (Signed)
Anesthesia Post Note  Patient: Kynesha A [place]port  Procedure(s) Performed: Procedure(s) (LRB): RIGHT THYROID LOBECTOMY WITH FROZEN SECTION (Right)  Patient location during evaluation: PACU Anesthesia Type: General Level of consciousness: awake and alert, oriented and patient cooperative Pain management: pain level controlled Vital Signs Assessment: post-procedure vital signs reviewed and stable Respiratory status: spontaneous breathing, nonlabored ventilation, respiratory function stable and patient connected to nasal cannula oxygen Cardiovascular status: blood pressure returned to baseline and stable Postop Assessment: no signs of nausea or vomiting Anesthetic complications: no    Last Vitals:  Filed Vitals:   11/17/15 1030 11/17/15 1045  BP: 189/76 157/75  Pulse: 81 79  Temp:    Resp: 21 21    Last Pain:  Filed Vitals:   11/17/15 1046  PainSc: 5                  Destany Severns,E. Harper Smoker

## 2015-11-18 ENCOUNTER — Encounter (HOSPITAL_COMMUNITY): Payer: Self-pay | Admitting: Otolaryngology

## 2015-11-18 DIAGNOSIS — D34 Benign neoplasm of thyroid gland: Secondary | ICD-10-CM | POA: Diagnosis not present

## 2015-11-18 LAB — BASIC METABOLIC PANEL
ANION GAP: 7 (ref 5–15)
BUN: 11 mg/dL (ref 6–20)
CHLORIDE: 105 mmol/L (ref 101–111)
CO2: 28 mmol/L (ref 22–32)
Calcium: 8.9 mg/dL (ref 8.9–10.3)
Creatinine, Ser: 0.88 mg/dL (ref 0.44–1.00)
GFR calc Af Amer: >60 mL/min (ref >60)
GLUCOSE: 140 mg/dL — AB (ref 65–99)
POTASSIUM: 3.7 mmol/L (ref 3.5–5.1)
Sodium: 140 mmol/L (ref 135–145)

## 2015-11-18 LAB — TSH: TSH: 0.252 u[IU]/mL — ABNORMAL LOW (ref 0.350–4.500)

## 2015-11-18 MED ORDER — PROMETHAZINE HCL 25 MG/ML IJ SOLN: 12.5000 mg | INTRAMUSCULAR | Status: DC | PRN

## 2015-11-18 MED ORDER — PROMETHAZINE HCL 25 MG/ML IJ SOLN
12.5000 mg | Freq: Four times a day (QID) | INTRAMUSCULAR | Status: DC | PRN
  Administered 2015-11-18: 12.5 mg via INTRAVENOUS
  Filled 2015-11-18: qty 1

## 2015-11-18 NOTE — Progress Notes (Signed)
Nausea/vomiting unrelieved by 2 doses of Zofran. Call placed to Dr. Janace Hoard' office. Await return call.

## 2015-11-18 NOTE — Discharge Summary (Signed)
Physician Discharge Summary  Patient ID: Mary Vega MRN: [id number] DOB/AGE: 03-Mar-1946 70 y.o.  Admit date: 11/17/2015 Discharge date: 11/18/2015  Admission Diagnoses:thyropid mass  Discharge Diagnoses: same Active Problems:   Thyroid mass   Discharged Condition: good  Hospital Course: hx of thyroid mass in past and now right sided nodule. She had right thyroidectomy and follicular lesion as frozen. She had some vomiting yesterday but no nausea today. Pain completely controlled. She wants to go home. No muscle twitching or cramps which would not be expected with this operation but she has had 2 thyroid surgeries before. She has excellent wound and strong voice. F/u in 1 week.   Consults: None  Significant Diagnostic Studies: none  Treatments: right thyroidectomy  Discharge Exam: Blood pressure 121/61, pulse 60, temperature 98.2 F (36.8 C), temperature source Oral, resp. rate 18, weight 81.599 kg (179 lb 14.3 oz), SpO2 94 %. alert awake. strong voice. wound looks excellent and drain removed. cv- rrrr l- clear abd- soft ext- no swelling or tenderness  Disposition: Final discharge disposition not confirmed  Discharge Instructions    Call MD for:  difficulty breathing, headache or visual disturbances    Complete by:  As directed      Call MD for:  extreme fatigue    Complete by:  As directed      Call MD for:  hives    Complete by:  As directed      Call MD for:  persistant dizziness or light-headedness    Complete by:  As directed      Call MD for:  persistant nausea and vomiting    Complete by:  As directed      Call MD for:  redness, tenderness, or signs of infection (pain, swelling, redness, odor or green/yellow discharge around incision site)    Complete by:  As directed      Call MD for:  severe uncontrolled pain    Complete by:  As directed      Call MD for:  temperature >100.4    Complete by:  As directed      Diet - low sodium heart healthy    Complete by:   As directed      Discharge instructions    Complete by:  As directed   Call for appointment in one week. Call if any muscle twitching,muscle cramps, numbness, or spasms of face or hand. Can get wound wet but no creams or products on the wound. Normal diet and call if any trouble.     Increase activity slowly    Complete by:  As directed             Medication List    TAKE these medications        docusate sodium 100 MG capsule  Commonly known as:  COLACE  Take 100 mg by mouth daily as needed for mild constipation.     LORazepam 1 MG tablet  Commonly known as:  ATIVAN  Take 1 mg by mouth every 6 (six) hours as needed for anxiety.     losartan 100 MG tablet  Commonly known as:  COZAAR  Take 100 mg by mouth daily.     multivitamin with minerals Tabs tablet  Take 1 tablet by mouth daily.     omega-3 acid ethyl esters 1 g capsule  Commonly known as:  LOVAZA  Take 1 g by mouth daily.     omeprazole 40 MG capsule  Commonly known as:  PRILOSEC  Take 40 mg by mouth daily.     sertraline 100 MG tablet  Commonly known as:  ZOLOFT  Take 100 mg by mouth daily.     simvastatin 40 MG tablet  Commonly known as:  ZOCOR  Take 40 mg by mouth daily.     spironolactone 25 MG tablet  Commonly known as:  ALDACTONE  Take 25 mg by mouth daily.     traZODone 100 MG tablet  Commonly known as:  DESYREL  Take 200 mg by mouth at bedtime.         SignedMelissa Montane 11/18/2015, 8:10 AM

## 2015-11-18 NOTE — Care Management Obs Status (Signed)
[place] NOTIFICATION   Patient Details  Name: Mary Vega MRN: [id number] Date of Birth: 12-Sep-1945   Medicare Observation Status Notification Given:  Yes (Medicare observation procedure)    Marilu Favre, RN 11/18/2015, 3:16 PM

## 2015-11-19 DIAGNOSIS — D34 Benign neoplasm of thyroid gland: Secondary | ICD-10-CM | POA: Diagnosis not present

## 2015-11-19 LAB — CALCIUM, IONIZED: CALCIUM, IONIZED, SERUM: 5 mg/dL (ref 4.5–5.6)

## 2015-11-19 MED ORDER — HYDROCODONE-ACETAMINOPHEN 5-325 MG PO TABS: 1.0000 | ORAL_TABLET | ORAL | Status: DC | PRN

## 2015-11-19 MED ORDER — PROMETHAZINE HCL 12.5 MG PO TABS: 12.5000 mg | ORAL_TABLET | ORAL | Status: DC | PRN

## 2015-11-19 NOTE — Discharge Summary (Signed)
11/19/2015 11:43 AM  Bradshaw, Ayeza [id number]  Post-Op Day 2 Discharge summary    Temp:  [97.8 F (36.6 C)-98.9 F (37.2 C)] 98.7 F (37.1 C) (03/18 0550) Pulse Rate:  [60-67] 67 (03/18 0550) Resp:  [17-18] 17 (03/18 0550) BP: (145-153)/(61-72) 153/61 mmHg (03/18 0550) SpO2:  [92 %-96 %] 96 % (03/18 0550),     Intake/Output Summary (Last 24 hours) at 11/19/15 1143 Last data filed at 11/18/15 1414  Gross per 24 hour  Intake    120 ml  Output      0 ml  Net    120 ml   N,V improved on Phenergan  Results for orders placed or performed during the hospital encounter of 11/17/15 (from the past 24 hour(s))  Basic metabolic panel     Status: Abnormal   Collection Time: 11/18/15  4:35 PM  Result Value Ref Range   Sodium 140 135 - 145 mmol/L   Potassium 3.7 3.5 - 5.1 mmol/L   Chloride 105 101 - 111 mmol/L   CO2 28 22 - 32 mmol/L   Glucose, Bld 140 (H) 65 - 99 mg/dL   BUN 11 6 - 20 mg/dL   Creatinine, Ser 0.88 0.44 - 1.00 mg/dL   Calcium 8.9 8.9 - 10.3 mg/dL   GFR calc non Af Amer >60 >60 mL/min   GFR calc Af Amer >60 >60 mL/min   Anion gap 7 5 - 15  TSH     Status: Abnormal   Collection Time: 11/18/15  4:35 PM  Result Value Ref Range   TSH 0.252 (L) 0.350 - 4.500 uIU/mL    SUBJECTIVE:  Mod pain, relieved by meds.  N,V better, but still reluctant to take anything more than water or ice cream.  OBJECTIVE:  Neck flat.  Voice clear.  Respirations good.  IMPRESSION:  Satisfactory post op check with controlled N,V  PLAN:  Discharge home.    Admit:  16 MAR D/C:  18 MAR Final diagnosis: thyroid mass Proc:  Thyroid lobectomy, 16 MAR Comp: prolonged N,V Cond: ambulatory, taking po fluids Rx's: Hydrocodone 5 mg, Phenergan, 12,5 mg Instructions written and given Recheck:  Dr. Serita Grit Course:  Underwent uneventful surgery.  Observed 23 hr and prepared for discharge.  Drain removed.  Developed N,V refractory to Ondansetron.  Controlled with Phenergan.  Breathing well.   No twitching or tingling.   Discharged home on POD 2.    Mary Vega

## 2016-07-31 DIAGNOSIS — I493 Ventricular premature depolarization: Secondary | ICD-10-CM | POA: Insufficient documentation

## 2016-11-11 ENCOUNTER — Ambulatory Visit
Admission: EM | Admit: 2016-11-11 | Discharge: 2016-11-11 | Disposition: A | Discharge status: Home / Self Care | Payer: Medicare Other | Attending: Family Medicine | Admitting: Family Medicine

## 2016-11-11 ENCOUNTER — Encounter: Payer: Self-pay | Admitting: Emergency Medicine

## 2016-11-11 DIAGNOSIS — J0100 Acute maxillary sinusitis, unspecified: Secondary | ICD-10-CM

## 2016-11-11 MED ORDER — AMOXICILLIN 875 MG PO TABS: 875.0000 mg | ORAL_TABLET | Freq: Two times a day (BID) | ORAL | 0 refills | Status: DC

## 2016-11-11 NOTE — ED Provider Notes (Signed)
[facility]    CSN: [id number] Arrival date & time: 11/11/16  1421     History   Chief Complaint Chief Complaint  Patient presents with  . Cough    HPI Mary Vega is a 71 y.o. female.   The history is provided by the patient.  Cough  Associated symptoms: fever and headaches   URI  Presenting symptoms: congestion, cough, facial pain and fever   Severity:  Moderate Onset quality:  Sudden Duration:  2 weeks Timing:  Constant Progression:  Worsening Chronicity:  New Relieved by:  Nothing Ineffective treatments:  OTC medications Associated symptoms: headaches and sinus pain   Risk factors: being elderly and sick contacts   Risk factors: no chronic cardiac disease, no chronic kidney disease, no chronic respiratory disease, no diabetes mellitus, no immunosuppression, no recent illness and no recent travel     Past Medical History:  Diagnosis Date  . Anxiety    takes Lorazepam daily  . Chronic lower back pain    buldging disc  . Constipation    takes Stool Softener daily as needed  . Depression    takes Zoloft daily  . GERD (gastroesophageal reflux disease)    takes Omeprazole daily  . History of blood transfusion    "when I had cancer surgery; hysterectomy"  . History of bronchitis    Sept 2016  . History of colon polyps    benign  . Hyperlipidemia    takes Simvastatin daily  . Hypertension    takes Losartan daily   . Insomnia    takes Trazodone nightly  . Kidney stone    "must have passed it"  . OSA on CPAP   . Ovarian cancer ([place])   . Peripheral edema    takes Spironolactone daily  . Peripheral neuropathy ([place])   . Pneumonia ~ 2012  . PONV (postoperative nausea and vomiting)   . Sinus headache   . Urinary frequency   . UTI (lower urinary tract infection)    being treated with Macrobid,completed today 11/11/15    Patient Active Problem List   Diagnosis Date Noted  . Thyroid mass 11/17/2015    Past Surgical History:  Procedure  Laterality Date  . ABDOMINAL HYSTERECTOMY    . CATARACT EXTRACTION W/ INTRAOCULAR LENS  IMPLANT, BILATERAL Bilateral   . COLONOSCOPY    . DIAGNOSTIC LAPAROSCOPY     to drain cyst on ovary  . THYROID LOBECTOMY Right 11/17/2015  . THYROID SURGERY  X 2   goiter removed  . THYROIDECTOMY Right 11/17/2015   Procedure: RIGHT THYROID LOBECTOMY WITH FROZEN SECTION;  Surgeon: Melissa Montane, MD;  Location: [facility];  Service: ENT;  Laterality: Right;    OB History    No data available       Home Medications    Prior to Admission medications   Medication Sig Start Date End Date Taking? Authorizing Provider  amoxicillin (AMOXIL) 875 MG tablet Take 1 tablet (875 mg total) by mouth 2 (two) times daily. 11/11/16   Norval Gable, MD  docusate sodium (COLACE) 100 MG capsule Take 100 mg by mouth daily as needed for mild constipation.    Historical Provider, MD  HYDROcodone-acetaminophen (NORCO/VICODIN) 5-325 MG tablet Take 1-2 tablets by mouth every 4 (four) hours as needed for moderate pain. 2/42/68   Jodi Marble, MD  LORazepam (ATIVAN) 1 MG tablet Take 1 mg by mouth every 6 (six) hours as needed for anxiety.    Historical Provider, MD  losartan (  COZAAR) 100 MG tablet Take 100 mg by mouth daily.    Historical Provider, MD  Multiple Vitamin (MULTIVITAMIN WITH MINERALS) TABS tablet Take 1 tablet by mouth daily.    Historical Provider, MD  omega-3 acid ethyl esters (LOVAZA) 1 g capsule Take 1 g by mouth daily.    Historical Provider, MD  omeprazole (PRILOSEC) 40 MG capsule Take 40 mg by mouth daily.    Historical Provider, MD  promethazine (PHENERGAN) 12.5 MG tablet Take 1 tablet (12.5 mg total) by mouth every 4 (four) hours as needed for nausea or vomiting. 5/95/63   Jodi Marble, MD  sertraline (ZOLOFT) 100 MG tablet Take 100 mg by mouth daily.    Historical Provider, MD  simvastatin (ZOCOR) 40 MG tablet Take 40 mg by mouth daily.    Historical Provider, MD  spironolactone (ALDACTONE) 25 MG tablet Take 25  mg by mouth daily.    Historical Provider, MD  traZODone (DESYREL) 100 MG tablet Take 200 mg by mouth at bedtime.    Historical Provider, MD    Family History History reviewed. No pertinent family history.  Social History Social History  Substance Use Topics  . Smoking status: Former Smoker    Packs/day: 0.50    Years: 47.00    Types: Cigarettes  . Smokeless tobacco: Never Used     Comment: quit smoking in ~ 2012  . Alcohol use No     Allergies   Iodine-131; Ivp dye [iodinated diagnostic agents]; Sulfa antibiotics; Sulfamethoxazole; Clams [shellfish allergy]; and Phenobarbital   Review of Systems Review of Systems  Constitutional: Positive for fever.  HENT: Positive for congestion and sinus pain.   Respiratory: Positive for cough.   Neurological: Positive for headaches.     Physical Exam Triage Vital Signs ED Triage Vitals  Enc Vitals Group     BP 11/11/16 1449 (!) 156/88     Pulse Rate 11/11/16 1449 63     Resp 11/11/16 1449 16     Temp 11/11/16 1449 98.4 F (36.9 C)     Temp Source 11/11/16 1449 Oral     SpO2 11/11/16 1449 100 %     Weight 11/11/16 1448 180 lb (81.6 kg)     Height 11/11/16 1448 5\' 5"  (1.651 m)     Head Circumference --      Peak Flow --      Pain Score 11/11/16 1448 6     Pain Loc --      Pain Edu? --      Excl. in [place]? --    No data found.   Updated Vital Signs BP (!) 156/88 (BP Location: Left Arm)   Pulse 63   Temp 98.4 F (36.9 C) (Oral)   Resp 16   Ht 5\' 5"  (1.651 m)   Wt 180 lb (81.6 kg)   SpO2 100%   BMI 29.95 kg/m   Visual Acuity Right Eye Distance:   Left Eye Distance:   Bilateral Distance:    Right Eye Near:   Left Eye Near:    Bilateral Near:     Physical Exam  Constitutional: She appears well-developed and well-nourished. No distress.  HENT:  Head: Normocephalic and atraumatic.  Right Ear: Tympanic membrane, external ear and ear canal normal.  Left Ear: Tympanic membrane, external ear and ear canal normal.    Nose: Mucosal edema and rhinorrhea present. No nose lacerations, sinus tenderness, nasal deformity, septal deviation or nasal septal hematoma. No epistaxis.  No foreign bodies. Right sinus  exhibits maxillary sinus tenderness and frontal sinus tenderness. Left sinus exhibits maxillary sinus tenderness and frontal sinus tenderness.  Mouth/Throat: Uvula is midline, oropharynx is clear and moist and mucous membranes are normal. No oropharyngeal exudate.  Eyes: Conjunctivae and EOM are normal. Pupils are equal, round, and reactive to light. Right eye exhibits no discharge. Left eye exhibits no discharge. No scleral icterus.  Neck: Normal range of motion. Neck supple. No thyromegaly present.  Cardiovascular: Normal rate, regular rhythm and normal heart sounds.   Pulmonary/Chest: Effort normal and breath sounds normal. No respiratory distress. She has no wheezes. She has no rales.  Lymphadenopathy:    She has no cervical adenopathy.  Skin: She is not diaphoretic.  Nursing note and vitals reviewed.    UC Treatments / Results  Labs (all labs ordered are listed, but only abnormal results are displayed) Labs Reviewed - No data to display  EKG  EKG Interpretation None       Radiology No results found.  Procedures Procedures (including critical care time)  Medications Ordered in UC Medications - No data to display   Initial Impression / Assessment and Plan / UC Course  I have reviewed the triage vital signs and the nursing notes.  Pertinent labs & imaging results that were available during my care of the patient were reviewed by me and considered in my medical decision making (see chart for details).       Final Clinical Impressions(s) / UC Diagnoses   Final diagnoses:  Acute maxillary sinusitis, recurrence not specified    New Prescriptions Discharge Medication List as of 11/11/2016  4:54 PM    START taking these medications   Details  amoxicillin (AMOXIL) 875 MG tablet  Take 1 tablet (875 mg total) by mouth 2 (two) times daily., Starting Sun 11/11/2016, Normal       1. diagnosis reviewed with patient 2. rx as per orders above; reviewed possible side effects, interactions, risks and benefits  3. Recommend supportive treatment with otc flonase 4. Follow-up prn if symptoms worsen or don't improve   Norval Gable, MD 11/11/16 1659

## 2016-11-11 NOTE — ED Triage Notes (Signed)
Patient c/o cough and chest congestion that started Feb. 28.  Patient reports HAs, sinus congestion and pressure that has gotten worse today.

## 2017-04-07 ENCOUNTER — Ambulatory Visit
Admission: EM | Admit: 2017-04-07 | Discharge: 2017-04-07 | Disposition: A | Discharge status: Home / Self Care | Payer: Medicare Other | Attending: Family Medicine | Admitting: Family Medicine

## 2017-04-07 ENCOUNTER — Encounter: Payer: Self-pay | Admitting: Gynecology

## 2017-04-07 DIAGNOSIS — K648 Other hemorrhoids: Secondary | ICD-10-CM | POA: Diagnosis not present

## 2017-04-07 DIAGNOSIS — B372 Candidiasis of skin and nail: Secondary | ICD-10-CM | POA: Diagnosis not present

## 2017-04-07 DIAGNOSIS — K644 Residual hemorrhoidal skin tags: Secondary | ICD-10-CM

## 2017-04-07 MED ORDER — FLUCONAZOLE 150 MG PO TABS: 150.0000 mg | ORAL_TABLET | Freq: Every day | ORAL | 0 refills | Status: DC

## 2017-04-07 MED ORDER — HYDROCORTISONE 2.5 % RE CREA: TOPICAL_CREAM | RECTAL | 0 refills | Status: DC

## 2017-04-07 NOTE — ED Provider Notes (Signed)
[facility]    CSN: [id number] Arrival date & time: 04/07/17  1328     History   Chief Complaint Chief Complaint  Patient presents with  . Hemorrhoids    HPI Mary Vega is a 71 y.o. female.   71 yo female with a c/o anal discomfort, itching and minimal blood streaks on toilet paper for the past 2-3 days. Denies any constipation, diarrhea, fevers, chills.    The history is provided by the patient.    Past Medical History:  Diagnosis Date  . Anxiety    takes Lorazepam daily  . Chronic lower back pain    buldging disc  . Constipation    takes Stool Softener daily as needed  . Depression    takes Zoloft daily  . GERD (gastroesophageal reflux disease)    takes Omeprazole daily  . History of blood transfusion    "when I had cancer surgery; hysterectomy"  . History of bronchitis    Sept 2016  . History of colon polyps    benign  . Hyperlipidemia    takes Simvastatin daily  . Hypertension    takes Losartan daily   . Insomnia    takes Trazodone nightly  . Kidney stone    "must have passed it"  . OSA on CPAP   . Ovarian cancer ([place])   . Peripheral edema    takes Spironolactone daily  . Peripheral neuropathy   . Pneumonia ~ 2012  . PONV (postoperative nausea and vomiting)   . Sinus headache   . Urinary frequency   . UTI (lower urinary tract infection)    being treated with Macrobid,completed today 11/11/15    Patient Active Problem List   Diagnosis Date Noted  . Thyroid mass 11/17/2015    Past Surgical History:  Procedure Laterality Date  . ABDOMINAL HYSTERECTOMY    . CATARACT EXTRACTION W/ INTRAOCULAR LENS  IMPLANT, BILATERAL Bilateral   . COLONOSCOPY    . DIAGNOSTIC LAPAROSCOPY     to drain cyst on ovary  . THYROID LOBECTOMY Right 11/17/2015  . THYROID SURGERY  X 2   goiter removed  . THYROIDECTOMY Right 11/17/2015   Procedure: RIGHT THYROID LOBECTOMY WITH FROZEN SECTION;  Surgeon: Melissa Montane, MD;  Location: [facility];  Service: ENT;   Laterality: Right;    OB History    No data available       Home Medications    Prior to Admission medications   Medication Sig Start Date End Date Taking? Authorizing Provider  docusate sodium (COLACE) 100 MG capsule Take 100 mg by mouth daily as needed for mild constipation.   Yes [provider]  LORazepam (ATIVAN) 1 MG tablet Take 1 mg by mouth every 6 (six) hours as needed for anxiety.   Yes [provider]  losartan (COZAAR) 100 MG tablet Take 100 mg by mouth daily.   Yes [provider]  Multiple Vitamin (MULTIVITAMIN WITH MINERALS) TABS tablet Take 1 tablet by mouth daily.   Yes [provider]  omega-3 acid ethyl esters (LOVAZA) 1 g capsule Take 1 g by mouth daily.   Yes [provider]  omeprazole (PRILOSEC) 40 MG capsule Take 40 mg by mouth daily.   Yes [provider]  sertraline (ZOLOFT) 100 MG tablet Take 100 mg by mouth daily.   Yes [provider]  simvastatin (ZOCOR) 40 MG tablet Take 40 mg by mouth daily.   Yes [provider]  spironolactone (ALDACTONE) 25 MG  tablet Take 25 mg by mouth daily.   Yes [provider]  traZODone (DESYREL) 100 MG tablet Take 200 mg by mouth at bedtime.   Yes [provider]  amoxicillin (AMOXIL) 875 MG tablet Take 1 tablet (875 mg total) by mouth 2 (two) times daily. 11/11/16   Norval Gable, MD  fluconazole (DIFLUCAN) 150 MG tablet Take 1 tablet (150 mg total) by mouth daily. 04/07/17   Norval Gable, MD  HYDROcodone-acetaminophen (NORCO/VICODIN) 5-325 MG tablet Take 1-2 tablets by mouth every 4 (four) hours as needed for moderate pain. 3/79/02   Jodi Marble, MD  hydrocortisone (ANUSOL-HC) 2.5 % rectal cream Apply rectally 2 times daily 04/07/17   Norval Gable, MD  promethazine (PHENERGAN) 12.5 MG tablet Take 1 tablet (12.5 mg total) by mouth every 4 (four) hours as needed for nausea or vomiting. 12/11/71   Jodi Marble, MD    Family History No  family history on file.  Social History Social History  Substance Use Topics  . Smoking status: Former Smoker    Packs/day: 0.50    Years: 47.00    Types: Cigarettes  . Smokeless tobacco: Never Used     Comment: quit smoking in ~ 2012  . Alcohol use No     Allergies   Iodine-131; Ivp dye [iodinated diagnostic agents]; Sulfa antibiotics; Sulfamethoxazole; Clams [shellfish allergy]; and Phenobarbital   Review of Systems Review of Systems   Physical Exam Triage Vital Signs ED Triage Vitals  Enc Vitals Group     BP 04/07/17 1424 (!) 148/60     Pulse Rate 04/07/17 1424 64     Resp 04/07/17 1424 16     Temp 04/07/17 1424 98.6 F (37 C)     Temp Source 04/07/17 1424 Oral     SpO2 04/07/17 1424 100 %     Weight 04/07/17 1421 180 lb (81.6 kg)     Height 04/07/17 1421 5\' 5"  (1.651 m)     Head Circumference --      Peak Flow --      Pain Score 04/07/17 1422 3     Pain Loc --      Pain Edu? --      Excl. in [place]? --    No data found.   Updated Vital Signs BP (!) 148/60 (BP Location: Left Arm)   Pulse 64   Temp 98.6 F (37 C) (Oral)   Resp 16   Ht 5\' 5"  (1.651 m)   Wt 180 lb (81.6 kg)   SpO2 100%   BMI 29.95 kg/m   Visual Acuity Right Eye Distance:   Left Eye Distance:   Bilateral Distance:    Right Eye Near:   Left Eye Near:    Bilateral Near:     Physical Exam  Constitutional: She appears well-developed and well-nourished. No distress.  Abdominal: Soft. She exhibits no distension.  Inflamed external hemorrhoids; and surrounding erythematous, scaly rash on perineum area  Skin: She is not diaphoretic.  Nursing note and vitals reviewed.    UC Treatments / Results  Labs (all labs ordered are listed, but only abnormal results are displayed) Labs Reviewed - No data to display  EKG  EKG Interpretation None       Radiology No results found.  Procedures Procedures (including critical care time)  Medications Ordered in UC Medications - No data  to display   Initial Impression / Assessment and Plan / UC Course  I have reviewed the triage vital signs and the  nursing notes.  Pertinent labs & imaging results that were available during my care of the patient were reviewed by me and considered in my medical decision making (see chart for details).       Final Clinical Impressions(s) / UC Diagnoses   Final diagnoses:  External hemorrhoids  Yeast dermatitis    New Prescriptions Discharge Medication List as of 04/07/2017  3:11 PM    START taking these medications   Details  fluconazole (DIFLUCAN) 150 MG tablet Take 1 tablet (150 mg total) by mouth daily., Starting Sun 04/07/2017, Normal    hydrocortisone (ANUSOL-HC) 2.5 % rectal cream Apply rectally 2 times daily, Normal       1. diagnosis reviewed with patient 2. rx as per orders above; reviewed possible side effects, interactions, risks and benefits  3. Recommend supportive treatment with pressure relief for hemorrhoids, increased fluids and fiber 4. Follow-up prn if symptoms worsen or don't improve   Norval Gable, MD 04/07/17 1524

## 2017-04-07 NOTE — Discharge Instructions (Signed)
Over the counter antifungal cream twice daily

## 2017-04-07 NOTE — ED Triage Notes (Signed)
Per patient hemorrhoids x 4 days. Patient stated itching and burning.

## 2019-02-14 ENCOUNTER — Emergency Department: Payer: Medicare Other

## 2019-02-14 ENCOUNTER — Other Ambulatory Visit: Payer: Self-pay

## 2019-02-14 ENCOUNTER — Encounter: Payer: Self-pay | Admitting: Emergency Medicine

## 2019-02-14 ENCOUNTER — Inpatient Hospital Stay
Admission: EM | Admit: 2019-02-14 | Discharge: 2019-02-16 | DRG: 690 | Disposition: A | Discharge status: Home / Self Care | Payer: Medicare Other | Attending: Internal Medicine | Admitting: Internal Medicine

## 2019-02-14 DIAGNOSIS — G8929 Other chronic pain: Secondary | ICD-10-CM | POA: Diagnosis present

## 2019-02-14 DIAGNOSIS — Z9841 Cataract extraction status, right eye: Secondary | ICD-10-CM | POA: Diagnosis not present

## 2019-02-14 DIAGNOSIS — Z8719 Personal history of other diseases of the digestive system: Secondary | ICD-10-CM

## 2019-02-14 DIAGNOSIS — Z87891 Personal history of nicotine dependence: Secondary | ICD-10-CM | POA: Diagnosis not present

## 2019-02-14 DIAGNOSIS — Z8543 Personal history of malignant neoplasm of ovary: Secondary | ICD-10-CM

## 2019-02-14 DIAGNOSIS — I10 Essential (primary) hypertension: Secondary | ICD-10-CM | POA: Diagnosis present

## 2019-02-14 DIAGNOSIS — F329 Major depressive disorder, single episode, unspecified: Secondary | ICD-10-CM | POA: Diagnosis present

## 2019-02-14 DIAGNOSIS — M545 Low back pain: Secondary | ICD-10-CM | POA: Diagnosis present

## 2019-02-14 DIAGNOSIS — N179 Acute kidney failure, unspecified: Secondary | ICD-10-CM | POA: Diagnosis present

## 2019-02-14 DIAGNOSIS — Z90710 Acquired absence of both cervix and uterus: Secondary | ICD-10-CM | POA: Diagnosis not present

## 2019-02-14 DIAGNOSIS — K219 Gastro-esophageal reflux disease without esophagitis: Secondary | ICD-10-CM | POA: Diagnosis present

## 2019-02-14 DIAGNOSIS — Z87442 Personal history of urinary calculi: Secondary | ICD-10-CM

## 2019-02-14 DIAGNOSIS — G4700 Insomnia, unspecified: Secondary | ICD-10-CM | POA: Diagnosis present

## 2019-02-14 DIAGNOSIS — G4733 Obstructive sleep apnea (adult) (pediatric): Secondary | ICD-10-CM | POA: Diagnosis present

## 2019-02-14 DIAGNOSIS — N10 Acute tubulo-interstitial nephritis: Secondary | ICD-10-CM | POA: Diagnosis not present

## 2019-02-14 DIAGNOSIS — Z961 Presence of intraocular lens: Secondary | ICD-10-CM | POA: Diagnosis present

## 2019-02-14 DIAGNOSIS — Z79899 Other long term (current) drug therapy: Secondary | ICD-10-CM | POA: Diagnosis not present

## 2019-02-14 DIAGNOSIS — F419 Anxiety disorder, unspecified: Secondary | ICD-10-CM | POA: Diagnosis present

## 2019-02-14 DIAGNOSIS — N12 Tubulo-interstitial nephritis, not specified as acute or chronic: Secondary | ICD-10-CM | POA: Diagnosis not present

## 2019-02-14 DIAGNOSIS — Z1159 Encounter for screening for other viral diseases: Secondary | ICD-10-CM

## 2019-02-14 DIAGNOSIS — Z9842 Cataract extraction status, left eye: Secondary | ICD-10-CM

## 2019-02-14 DIAGNOSIS — E785 Hyperlipidemia, unspecified: Secondary | ICD-10-CM | POA: Diagnosis present

## 2019-02-14 DIAGNOSIS — K5900 Constipation, unspecified: Secondary | ICD-10-CM | POA: Diagnosis present

## 2019-02-14 LAB — SARS CORONAVIRUS 2 BY RT PCR (HOSPITAL ORDER, PERFORMED IN ~~LOC~~ HOSPITAL LAB): SARS Coronavirus 2: NEGATIVE

## 2019-02-14 LAB — LACTIC ACID, PLASMA: Lactic Acid, Venous: 0.9 mmol/L (ref 0.5–1.9)

## 2019-02-14 LAB — CBC WITH DIFFERENTIAL/PLATELET
Abs Immature Granulocytes: 0.05 10*3/uL (ref 0.00–0.07)
Basophils Absolute: 0.1 10*3/uL (ref 0.0–0.1)
Basophils Relative: 1 %
Eosinophils Absolute: 0.1 10*3/uL (ref 0.0–0.5)
Eosinophils Relative: 1 %
HCT: 37.1 % (ref 36.0–46.0)
Hemoglobin: 12.5 g/dL (ref 12.0–15.0)
Immature Granulocytes: 1 %
Lymphocytes Relative: 12 %
Lymphs Abs: 1.3 10*3/uL (ref 0.7–4.0)
MCH: 30.2 pg (ref 26.0–34.0)
MCHC: 33.7 g/dL (ref 30.0–36.0)
MCV: 89.6 fL (ref 80.0–100.0)
Monocytes Absolute: 1.2 10*3/uL — ABNORMAL HIGH (ref 0.1–1.0)
Monocytes Relative: 11 %
Neutro Abs: 8.2 10*3/uL — ABNORMAL HIGH (ref 1.7–7.7)
Neutrophils Relative %: 74 %
Platelets: 179 10*3/uL (ref 150–400)
RBC: 4.14 MIL/uL (ref 3.87–5.11)
RDW: 12.4 % (ref 11.5–15.5)
WBC: 10.9 10*3/uL — ABNORMAL HIGH (ref 4.0–10.5)
nRBC: 0 % (ref 0.0–0.2)

## 2019-02-14 LAB — COMPREHENSIVE METABOLIC PANEL
ALT: 19 U/L (ref 0–44)
AST: 18 U/L (ref 15–41)
Albumin: 4.1 g/dL (ref 3.5–5.0)
Alkaline Phosphatase: 72 U/L (ref 38–126)
Anion gap: 7 (ref 5–15)
BUN: 18 mg/dL (ref 8–23)
CO2: 25 mmol/L (ref 22–32)
Calcium: 10 mg/dL (ref 8.9–10.3)
Chloride: 102 mmol/L (ref 98–111)
Creatinine, Ser: 1.14 mg/dL — ABNORMAL HIGH (ref 0.44–1.00)
GFR calc Af Amer: 55 mL/min — ABNORMAL LOW (ref >60)
GFR calc non Af Amer: 48 mL/min — ABNORMAL LOW (ref >60)
Glucose, Bld: 111 mg/dL — ABNORMAL HIGH (ref 70–99)
Potassium: 4.3 mmol/L (ref 3.5–5.1)
Sodium: 134 mmol/L — ABNORMAL LOW (ref 135–145)
Total Bilirubin: 0.8 mg/dL (ref 0.3–1.2)
Total Protein: 7.7 g/dL (ref 6.5–8.1)

## 2019-02-14 LAB — URINALYSIS, COMPLETE (UACMP) WITH MICROSCOPIC
Bacteria, UA: NONE SEEN
Bilirubin Urine: NEGATIVE
Glucose, UA: NEGATIVE mg/dL
Ketones, ur: NEGATIVE mg/dL
Leukocytes,Ua: NEGATIVE
Nitrite: NEGATIVE
Protein, ur: NEGATIVE mg/dL
Specific Gravity, Urine: 1.011 (ref 1.005–1.030)
pH: 6 (ref 5.0–8.0)

## 2019-02-14 MED ORDER — LORAZEPAM 1 MG PO TABS
1.0000 mg | ORAL_TABLET | Freq: Four times a day (QID) | ORAL | Status: DC | PRN
  Administered 2019-02-14 – 2019-02-15 (×5): 1 mg via ORAL
  Filled 2019-02-14 (×5): qty 1

## 2019-02-14 MED ORDER — ACETAMINOPHEN 650 MG RE SUPP: 650.0000 mg | Freq: Four times a day (QID) | RECTAL | Status: DC | PRN

## 2019-02-14 MED ORDER — ONDANSETRON HCL 4 MG/2ML IJ SOLN
4.0000 mg | Freq: Once | INTRAMUSCULAR | Status: AC
  Administered 2019-02-14: 4 mg via INTRAVENOUS
  Filled 2019-02-14: qty 2

## 2019-02-14 MED ORDER — SIMVASTATIN 20 MG PO TABS
40.0000 mg | ORAL_TABLET | Freq: Every day | ORAL | Status: DC
  Administered 2019-02-14 – 2019-02-15 (×2): 40 mg via ORAL
  Filled 2019-02-14 (×3): qty 2

## 2019-02-14 MED ORDER — SERTRALINE HCL 50 MG PO TABS
100.0000 mg | ORAL_TABLET | Freq: Every day | ORAL | Status: DC
  Administered 2019-02-14 – 2019-02-16 (×3): 100 mg via ORAL
  Filled 2019-02-14 (×3): qty 2

## 2019-02-14 MED ORDER — ONDANSETRON HCL 4 MG PO TABS: 4.0000 mg | ORAL_TABLET | Freq: Four times a day (QID) | ORAL | Status: DC | PRN

## 2019-02-14 MED ORDER — SODIUM CHLORIDE 0.9 % IV SOLN
1.0000 g | INTRAVENOUS | Status: DC
  Administered 2019-02-15: 10:00:00 1 g via INTRAVENOUS
  Filled 2019-02-14: qty 1
  Filled 2019-02-14: qty 10

## 2019-02-14 MED ORDER — SODIUM CHLORIDE 0.9 % IV SOLN
INTRAVENOUS | Status: DC
  Administered 2019-02-14 – 2019-02-15 (×3): via INTRAVENOUS

## 2019-02-14 MED ORDER — DOCUSATE SODIUM 100 MG PO CAPS
100.0000 mg | ORAL_CAPSULE | Freq: Two times a day (BID) | ORAL | Status: DC
  Administered 2019-02-14: 100 mg via ORAL
  Filled 2019-02-14 (×4): qty 1

## 2019-02-14 MED ORDER — POLYETHYLENE GLYCOL 3350 17 G PO PACK: 17.0000 g | PACK | Freq: Every day | ORAL | Status: DC | PRN

## 2019-02-14 MED ORDER — DOCUSATE SODIUM 100 MG PO CAPS: 100.0000 mg | ORAL_CAPSULE | Freq: Every day | ORAL | Status: DC | PRN

## 2019-02-14 MED ORDER — AMLODIPINE BESYLATE 5 MG PO TABS
2.5000 mg | ORAL_TABLET | Freq: Every day | ORAL | Status: DC
  Administered 2019-02-14 – 2019-02-16 (×3): 2.5 mg via ORAL
  Filled 2019-02-14 (×3): qty 1

## 2019-02-14 MED ORDER — FAMOTIDINE 20 MG PO TABS
20.0000 mg | ORAL_TABLET | Freq: Two times a day (BID) | ORAL | Status: DC
  Administered 2019-02-14 – 2019-02-16 (×5): 20 mg via ORAL
  Filled 2019-02-14 (×5): qty 1

## 2019-02-14 MED ORDER — ENOXAPARIN SODIUM 40 MG/0.4ML ~~LOC~~ SOLN
40.0000 mg | SUBCUTANEOUS | Status: DC
  Administered 2019-02-14 – 2019-02-15 (×2): 40 mg via SUBCUTANEOUS
  Filled 2019-02-14 (×2): qty 0.4

## 2019-02-14 MED ORDER — TRAZODONE HCL 50 MG PO TABS
200.0000 mg | ORAL_TABLET | Freq: Every day | ORAL | Status: DC
  Administered 2019-02-14 – 2019-02-15 (×2): 200 mg via ORAL
  Filled 2019-02-14 (×2): qty 4

## 2019-02-14 MED ORDER — ACETAMINOPHEN 325 MG PO TABS
650.0000 mg | ORAL_TABLET | Freq: Four times a day (QID) | ORAL | Status: DC | PRN
  Administered 2019-02-14 – 2019-02-15 (×3): 650 mg via ORAL
  Filled 2019-02-14 (×3): qty 2

## 2019-02-14 MED ORDER — SODIUM CHLORIDE 0.9 % IV BOLUS
1000.0000 mL | Freq: Once | INTRAVENOUS | Status: AC
  Administered 2019-02-14: 1000 mL via INTRAVENOUS

## 2019-02-14 MED ORDER — PROMETHAZINE HCL 25 MG/ML IJ SOLN: 12.5000 mg | Freq: Four times a day (QID) | INTRAMUSCULAR | Status: DC | PRN

## 2019-02-14 MED ORDER — ONDANSETRON HCL 4 MG/2ML IJ SOLN: 4.0000 mg | Freq: Four times a day (QID) | INTRAMUSCULAR | Status: DC | PRN

## 2019-02-14 MED ORDER — SODIUM CHLORIDE 0.9 % IV SOLN
2.0000 g | Freq: Once | INTRAVENOUS | Status: AC
  Administered 2019-02-14: 2 g via INTRAVENOUS
  Filled 2019-02-14: qty 20

## 2019-02-14 NOTE — ED Notes (Signed)
ED TO INPATIENT HANDOFF REPORT  ED Nurse Name and Phone #:  Berenda Morale RN [phone]  S Name/Age/Gender Mary Vega 73 y.o. female Room/Bed: ED05A/ED05A  Code Status   Code Status: Prior  Home/SNF/Other Home Patient oriented to: self, place, time and situation Is this baseline? Yes   Triage Complete: Triage complete  Chief Complaint fever  Triage Note Patient ambulatory to triage with complaints of worsening UTI type sx since being treated at [facility] on Wednesday.  Pt was diagnosed Wednesday and reports fever of 101.5 at home and lower back pain.    Constant dull pain in lower back worse with percussion.  Pt also c/o nausea and poor appetite.    Pt reports taking 5 of ciprofloxacin prescribed. Speaking in complete coherent sentences. No acute breathing distress noted.    Allergies Allergies  Allergen Reactions  . Iodine-131 Anaphylaxis and Itching  . Ivp Dye [Iodinated Diagnostic Agents] Anaphylaxis  . Sulfa Antibiotics Hives and Other (See Comments)    Septic?  . Sulfamethoxazole Hives and Rash  . Clams [Shellfish Allergy] Diarrhea and Nausea And Vomiting  . Phenobarbital Other (See Comments)    Tremor (intolerance)    Level of Care/Admitting Diagnosis ED Disposition    ED Disposition Condition [facility]: [place] [[id number]]  Level of Care: Med-Surg [16]  Covid Evaluation: Confirmed COVID Negative  Diagnosis: Acute pyelonephritis [[id number]]  Admitting Physician: [name] [[id number]]  Attending Physician: [name] [[id number]]  Estimated length of stay: past midnight tomorrow  Certification:: I certify this patient will need inpatient services for at least 2 midnights  Bed request comments: 1c  PT Class (Do Not Modify): Inpatient [101]  PT Acc Code (Do Not Modify): Private [1]       B Medical/Surgery History Past Medical History:  Diagnosis Date  . Anxiety    takes Lorazepam daily  . Chronic lower back  pain    buldging disc  . Constipation    takes Stool Softener daily as needed  . Depression    takes Zoloft daily  . GERD (gastroesophageal reflux disease)    takes Omeprazole daily  . History of blood transfusion    "when I had cancer surgery; hysterectomy"  . History of bronchitis    Sept 2016  . History of colon polyps    benign  . Hyperlipidemia    takes Simvastatin daily  . Hypertension    takes Losartan daily   . Insomnia    takes Trazodone nightly  . Kidney stone    "must have passed it"  . OSA on CPAP   . Ovarian cancer ([place])   . Peripheral edema    takes Spironolactone daily  . Peripheral neuropathy   . Pneumonia ~ 2012  . PONV (postoperative nausea and vomiting)   . Sinus headache   . Urinary frequency   . UTI (lower urinary tract infection)    being treated with Macrobid,completed today 11/11/15   Past Surgical History:  Procedure Laterality Date  . ABDOMINAL HYSTERECTOMY    . CATARACT EXTRACTION W/ INTRAOCULAR LENS  IMPLANT, BILATERAL Bilateral   . COLONOSCOPY    . DIAGNOSTIC LAPAROSCOPY     to drain cyst on ovary  . THYROID LOBECTOMY Right 11/17/2015  . THYROID SURGERY  X 2   goiter removed  . THYROIDECTOMY Right 11/17/2015   Procedure: RIGHT THYROID LOBECTOMY WITH FROZEN SECTION;  Surgeon: Melissa Montane, MD;  Location: [place];  Service: ENT;  Laterality: Right;     A IV Location/Drains/Wounds Patient Lines/Drains/Airways Status   Active Line/Drains/Airways    Name:   Placement date:   Placement time:   Site:   Days:   Peripheral IV 02/14/19 Right Antecubital   02/14/19    0737    Antecubital   less than 1   Peripheral IV 02/14/19 Right Wrist   02/14/19    0910    Wrist   less than 1   Incision (Closed) 11/17/15 Neck Right   11/17/15    0925     1185          Intake/Output Last 24 hours No intake or output data in the 24 hours ending 02/14/19 1121  Labs/Imaging Results for orders placed or performed during the hospital encounter of 02/14/19 (from  the past 48 hour(s))  Urinalysis, Complete w Microscopic     Status: Abnormal   Collection Time: 02/14/19  7:38 AM  Result Value Ref Range   Color, Urine YELLOW (A) YELLOW   APPearance CLEAR (A) CLEAR   Specific Gravity, Urine 1.011 1.005 - 1.030   pH 6.0 5.0 - 8.0   Glucose, UA NEGATIVE NEGATIVE mg/dL   Hgb urine dipstick SMALL (A) NEGATIVE   Bilirubin Urine NEGATIVE NEGATIVE   Ketones, ur NEGATIVE NEGATIVE mg/dL   Protein, ur NEGATIVE NEGATIVE mg/dL   Nitrite NEGATIVE NEGATIVE   Leukocytes,Ua NEGATIVE NEGATIVE   RBC / HPF 6-10 0 - 5 RBC/hpf   WBC, UA 11-20 0 - 5 WBC/hpf   Bacteria, UA NONE SEEN NONE SEEN   Squamous Epithelial / LPF 0-5 0 - 5   Mucus PRESENT     Comment: Performed at [facility], [address] [place], [place] [address]  Comprehensive metabolic panel     Status: Abnormal   Collection Time: 02/14/19  7:38 AM  Result Value Ref Range   Sodium 134 (L) 135 - 145 mmol/L   Potassium 4.3 3.5 - 5.1 mmol/L   Chloride 102 98 - 111 mmol/L   CO2 25 22 - 32 mmol/L   Glucose, Bld 111 (H) 70 - 99 mg/dL   BUN 18 8 - 23 mg/dL   Creatinine, Ser 1.14 (H) 0.44 - 1.00 mg/dL   Calcium 10.0 8.9 - 10.3 mg/dL   Total Protein 7.7 6.5 - 8.1 g/dL   Albumin 4.1 3.5 - 5.0 g/dL   AST 18 15 - 41 U/L   ALT 19 0 - 44 U/L   Alkaline Phosphatase 72 38 - 126 U/L   Total Bilirubin 0.8 0.3 - 1.2 mg/dL   GFR calc non Af Amer 48 (L) >60 mL/min   GFR calc Af Amer 55 (L) >60 mL/min   Anion gap 7 5 - 15    Comment: Performed at [facility], [address] [place], [place] [address]  CBC with Differential/Platelet     Status: Abnormal   Collection Time: 02/14/19  7:38 AM  Result Value Ref Range   WBC 10.9 (H) 4.0 - 10.5 K/uL   RBC 4.14 3.87 - 5.11 MIL/uL   Hemoglobin 12.5 12.0 - 15.0 g/dL   HCT 37.1 36.0 - 46.0 %   MCV 89.6 80.0 - 100.0 fL   MCH 30.2 26.0 - 34.0 pg   MCHC 33.7 30.0 - 36.0 g/dL   RDW 12.4 11.5 - 15.5 %   Platelets 179 150 - 400 K/uL   nRBC 0.0 0.0 -  0.2 %   Neutrophils Relative % 74 %  Neutro Abs 8.2 (H) 1.7 - 7.7 K/uL   Lymphocytes Relative 12 %   Lymphs Abs 1.3 0.7 - 4.0 K/uL   Monocytes Relative 11 %   Monocytes Absolute 1.2 (H) 0.1 - 1.0 K/uL   Eosinophils Relative 1 %   Eosinophils Absolute 0.1 0.0 - 0.5 K/uL   Basophils Relative 1 %   Basophils Absolute 0.1 0.0 - 0.1 K/uL   Immature Granulocytes 1 %   Abs Immature Granulocytes 0.05 0.00 - 0.07 K/uL    Comment: Performed at [facility], [address] [place], [place] [address]  Lactic acid, plasma     Status: None   Collection Time: 02/14/19  8:58 AM  Result Value Ref Range   Lactic Acid, Venous 0.9 0.5 - 1.9 mmol/L    Comment: Performed at [facility], [address] [place], [place] [address]  SARS Coronavirus 2 (CEPHEID - Performed in [place] hospital lab), Hosp Order     Status: None   Collection Time: 02/14/19  8:58 AM   Specimen: Nasopharyngeal Swab  Result Value Ref Range   SARS Coronavirus 2 NEGATIVE NEGATIVE    Comment: (NOTE) If result is NEGATIVE SARS-CoV-2 target nucleic acids are NOT DETECTED. The SARS-CoV-2 RNA is generally detectable in upper and lower  respiratory specimens during the acute phase of infection. The lowest  concentration of SARS-CoV-2 viral copies this assay can detect is 250  copies / mL. A negative result does not preclude SARS-CoV-2 infection  and should not be used as the sole basis for treatment or other  patient management decisions.  A negative result may occur with  improper specimen collection / handling, submission of specimen other  than nasopharyngeal swab, presence of viral mutation(s) within the  areas targeted by this assay, and inadequate number of viral copies  (<250 copies / mL). A negative result must be combined with clinical  observations, patient history, and epidemiological information. If result is POSITIVE SARS-CoV-2 target nucleic acids are DETECTED. The SARS-CoV-2 RNA is generally  detectable in upper and lower  respiratory specimens dur ing the acute phase of infection.  Positive  results are indicative of active infection with SARS-CoV-2.  Clinical  correlation with patient history and other diagnostic information is  necessary to determine patient infection status.  Positive results do  not rule out bacterial infection or co-infection with other viruses. If result is PRESUMPTIVE POSTIVE SARS-CoV-2 nucleic acids MAY BE PRESENT.   A presumptive positive result was obtained on the submitted specimen  and confirmed on repeat testing.  While 2019 novel coronavirus  (SARS-CoV-2) nucleic acids may be present in the submitted sample  additional confirmatory testing may be necessary for epidemiological  and / or clinical management purposes  to differentiate between  SARS-CoV-2 and other Sarbecovirus currently known to infect humans.  If clinically indicated additional testing with an alternate test  methodology [phone]) is advised. The SARS-CoV-2 RNA is generally  detectable in upper and lower respiratory sp ecimens during the acute  phase of infection. The expected result is Negative. Fact Sheet for Patients:  StrictlyIdeas.no Fact Sheet for Healthcare Providers: BankingDealers.co.za This test is not yet approved or cleared by the [place] FDA and has been authorized for detection and/or diagnosis of SARS-CoV-2 by FDA under an Emergency Use Authorization (EUA).  This EUA will remain in effect (meaning this test can be used) for the duration of the COVID-19 declaration under Section 564(b)(1) of the Act, 21 U.S.C. section 360bbb-3(b)(1), unless the  authorization is terminated or revoked sooner. Performed at [facility], [address], [place], [place] [address]    Ct Renal Joaquim Lai Study  Result Date: 02/14/2019 CLINICAL DATA:  Left-sided flank pain. Fever. Nephrolithiasis. Urinary tract infection.  EXAM: CT ABDOMEN AND PELVIS WITHOUT CONTRAST TECHNIQUE: Multidetector CT imaging of the abdomen and pelvis was performed following the standard protocol without IV contrast. COMPARISON:  None. FINDINGS: Lower chest: No acute findings. Hepatobiliary: No mass visualized on this unenhanced exam. Gallbladder is unremarkable. Pancreas: No mass or inflammatory process visualized on this unenhanced exam. Spleen:  Within normal limits in size. Adrenals/Urinary tract: No evidence of urolithiasis or hydronephrosis. Mild unilateral left perinephric stranding is seen,, suspicious for pyelonephritis. Unremarkable unopacified urinary bladder. Stomach/Bowel: No evidence of obstruction, inflammatory process, or abnormal fluid collections. Normal appendix visualized. Diverticulosis is seen mainly involving the sigmoid colon, however there is no evidence of diverticulitis. Vascular/Lymphatic: No pathologically enlarged lymph nodes identified. No evidence of abdominal aortic aneurysm. Aortic atherosclerosis. Reproductive: Prior hysterectomy noted. Adnexal regions are unremarkable in appearance. Other:  None. Musculoskeletal:  No suspicious bone lesions identified. IMPRESSION: 1. Mild left perinephric stranding, suspicious for pyelonephritis. 2. No evidence of urolithiasis or hydronephrosis. 3. Colonic diverticulosis, without radiographic evidence of diverticulitis. Aortic Atherosclerosis (ICD10-I70.0). Electronically Signed   By: Earle Gell M.D.   On: 02/14/2019 09:13    Pending Labs Unresulted Labs (From admission, onward)    Start     Ordered   02/14/19 0743  Urine culture  ONCE - STAT,   STAT     02/14/19 0742   02/14/19 0743  Blood culture (routine x 2)  BLOOD CULTURE X 2,   STAT     02/14/19 0742   Signed and Held  CBC  (enoxaparin (LOVENOX)    CrCl < 30 ml/min)  Once,   R    Comments: Baseline for enoxaparin therapy IF NOT ALREADY DRAWN.  Notify MD if PLT < 100 K.    Signed and Held   Signed and Held  Creatinine,  serum  (enoxaparin (LOVENOX)    CrCl < 30 ml/min)  Once,   R    Comments: Baseline for enoxaparin therapy IF NOT ALREADY DRAWN.    Signed and Held   Signed and Held  Creatinine, serum  (enoxaparin (LOVENOX)    CrCl < 30 ml/min)  Weekly,   R    Comments: while on enoxaparin therapy.    Signed and Held   Signed and Held  Urine culture  Once,   R    Question:  Patient immune status  Answer:  Immunocompromised   Signed and Held   Signed and Held  Comprehensive metabolic panel  Tomorrow morning,   R     Signed and Held   Signed and Held  CBC  Tomorrow morning,   R     Signed and Held          Vitals/Pain Today's Vitals   02/14/19 0655 02/14/19 0656 02/14/19 0946  BP: (!) 155/62  123/68  Pulse: 78  66  Resp: 18  18  Temp: 98.5 F (36.9 C)    TempSrc: Oral    SpO2: 97%  97%  Weight:  79.4 kg   Height:  5\' 5"  (1.651 m)   PainSc:  4      Isolation Precautions No active isolations  Medications Medications  cefTRIAXone (ROCEPHIN) 1 g in sodium chloride 0.9 % 100 mL IVPB (has no administration in time range)  sodium chloride  0.9 % bolus 1,000 mL (0 mLs Intravenous Stopped 02/14/19 1042)  cefTRIAXone (ROCEPHIN) 2 g in sodium chloride 0.9 % 100 mL IVPB (0 g Intravenous Stopped 02/14/19 0941)  ondansetron (ZOFRAN) injection 4 mg (4 mg Intravenous Given 02/14/19 0908)    Mobility walks Low fall risk   Focused Assessments   R Recommendations: See Admitting Provider Note  Report given to:   Additional Notes:

## 2019-02-14 NOTE — H&P (Signed)
[facility] NAME: Mary Vega    MR#:  [id number]  DATE OF BIRTH:  1945-09-09  DATE OF ADMISSION:  02/14/2019  PRIMARY CARE PHYSICIAN: Nicola Girt, DO   REQUESTING/REFERRING PHYSICIAN: Duffy Bruce, MD  CHIEF COMPLAINT:   Subjective fever flank pain and urinary frequency HISTORY OF PRESENT ILLNESS:  Mary Vega  is a 73 y.o. female with a known history of hypertension, obstructive sleep apnea, history of ovarian cancer, GERD, depression and other medical problems is presenting to the ED with a chief complaint of right greater than left-sided flank pain associated with fever at home.  Patient has seen by her primary care physician for dysuria last week and given 1 dose of IM Rocephin and sent home with ciprofloxacin.  Patient was not clinically improving and started having right-sided flank pain associated with nausea with a decreased appetite.  She endorsed her temperature was at 1-1.6 at home yesterday and came into the ED.  [place] test is negative and CT renal stone study suspicious for pyelonephritis.  No hydronephrosis  PAST MEDICAL HISTORY:   Past Medical History:  Diagnosis Date  . Anxiety    takes Lorazepam daily  . Chronic lower back pain    buldging disc  . Constipation    takes Stool Softener daily as needed  . Depression    takes Zoloft daily  . GERD (gastroesophageal reflux disease)    takes Omeprazole daily  . History of blood transfusion    "when I had cancer surgery; hysterectomy"  . History of bronchitis    Sept 2016  . History of colon polyps    benign  . Hyperlipidemia    takes Simvastatin daily  . Hypertension    takes Losartan daily   . Insomnia    takes Trazodone nightly  . Kidney stone    "must have passed it"  . OSA on CPAP   . Ovarian cancer ([place])   . Peripheral edema    takes Spironolactone daily  . Peripheral neuropathy   . Pneumonia ~ 2012  . PONV (postoperative  nausea and vomiting)   . Sinus headache   . Urinary frequency   . UTI (lower urinary tract infection)    being treated with Macrobid,completed today 11/11/15    PAST SURGICAL HISTOIRY:   Past Surgical History:  Procedure Laterality Date  . ABDOMINAL HYSTERECTOMY    . CATARACT EXTRACTION W/ INTRAOCULAR LENS  IMPLANT, BILATERAL Bilateral   . COLONOSCOPY    . DIAGNOSTIC LAPAROSCOPY     to drain cyst on ovary  . THYROID LOBECTOMY Right 11/17/2015  . THYROID SURGERY  X 2   goiter removed  . THYROIDECTOMY Right 11/17/2015   Procedure: RIGHT THYROID LOBECTOMY WITH FROZEN SECTION;  Surgeon: Melissa Montane, MD;  Location: [facility] OR;  Service: ENT;  Laterality: Right;    SOCIAL HISTORY:   Social History   Tobacco Use  . Smoking status: Former Smoker    Packs/day: 0.50    Years: 47.00    Pack years: 23.50    Types: Cigarettes  . Smokeless tobacco: Never Used  . Tobacco comment: quit smoking in ~ 2012  Substance Use Topics  . Alcohol use: No    FAMILY HISTORY:  History reviewed. No pertinent family history.  DRUG ALLERGIES:   Allergies  Allergen Reactions  . Iodine-131 Anaphylaxis and Itching  . Ivp Dye [Iodinated Diagnostic Agents] Anaphylaxis  . Sulfa Antibiotics Hives and Other (See Comments)  Septic?  . Sulfamethoxazole Hives and Rash  . Clams [Shellfish Allergy] Diarrhea and Nausea And Vomiting  . Phenobarbital Other (See Comments)    Tremor (intolerance)    REVIEW OF SYSTEMS:  CONSTITUTIONAL: Reporting subjective fever, fatigue or weakness.  EYES: No blurred or double vision.  EARS, NOSE, AND THROAT: No tinnitus or ear pain.  RESPIRATORY: No cough, shortness of breath, wheezing or hemoptysis.  CARDIOVASCULAR: No chest pain, orthopnea, edema.  GASTROINTESTINAL: Reports nausea, vomiting, right-sided abdominal pain and flank pain denies diarrhea .  GENITOURINARY: No dysuria, hematuria.  ENDOCRINE: No polyuria, nocturia,  HEMATOLOGY: No anemia, easy bruising or  bleeding SKIN: No rash or lesion. MUSCULOSKELETAL: No joint pain or arthritis.   NEUROLOGIC: No tingling, numbness, weakness.  PSYCHIATRY: No anxiety or depression.   MEDICATIONS AT HOME:   Prior to Admission medications   Medication Sig Start Date End Date Taking? Authorizing Provider  amLODipine (NORVASC) 2.5 MG tablet Take 2.5 mg by mouth daily.   Yes [provider]  ciprofloxacin (CIPRO) 500 MG tablet Take 500 mg by mouth 2 (two) times daily.   Yes [provider]  docusate sodium (COLACE) 100 MG capsule Take 100 mg by mouth daily as needed for mild constipation.   Yes [provider]  famotidine (PEPCID) 20 MG tablet Take 20 mg by mouth 2 (two) times daily.   Yes [provider]  LORazepam (ATIVAN) 1 MG tablet Take 1 mg by mouth every 6 (six) hours as needed for anxiety.   Yes [provider]  Multiple Vitamin (MULTIVITAMIN WITH MINERALS) TABS tablet Take 1 tablet by mouth daily.   Yes [provider]  sertraline (ZOLOFT) 100 MG tablet Take 100 mg by mouth daily.   Yes [provider]  simvastatin (ZOCOR) 40 MG tablet Take 40 mg by mouth at bedtime.    Yes [provider]  spironolactone (ALDACTONE) 25 MG tablet Take 25 mg by mouth 2 (two) times daily.    Yes [provider]  telmisartan (MICARDIS) 80 MG tablet Take 80 mg by mouth daily.   Yes [provider]  traZODone (DESYREL) 100 MG tablet Take 200 mg by mouth at bedtime.   Yes [provider]      VITAL SIGNS:  Blood pressure (!) 147/78, pulse 71, temperature 99.6 F (37.6 C), temperature source Oral, resp. rate 18, height 5\' 5"  (1.651 m), weight 79.4 kg, SpO2 96 %.  PHYSICAL EXAMINATION:  GENERAL:  73 y.o.-year-old patient lying in the bed with no acute distress.  EYES: Pupils equal, round, reactive to light and accommodation. No scleral icterus. Extraocular muscles intact.  HEENT: Head atraumatic, normocephalic. Oropharynx  and nasopharynx clear.  NECK:  Supple, no jugular venous distention. No thyroid enlargement, no tenderness.  LUNGS: Normal breath sounds bilaterally, no wheezing, rales,rhonchi or crepitation. No use of accessory muscles of respiration.  CARDIOVASCULAR: S1, S2 normal. No murmurs, rubs, or gallops.  ABDOMEN: Soft, nontender, nondistended. Bowel sounds present.  Right flank is tender EXTREMITIES: No pedal edema, cyanosis, or clubbing.  NEUROLOGIC: Cranial nerves II through XII are intact. Muscle strength 5/5 in all extremities. Sensation intact. Gait not checked.  PSYCHIATRIC: The patient is alert and oriented x 3.  SKIN: No obvious rash, lesion, or ulcer.   LABORATORY PANEL:   CBC Recent Labs  Lab 02/14/19 0738  WBC 10.9*  HGB 12.5  HCT 37.1  PLT 179   ------------------------------------------------------------------------------------------------------------------  Chemistries  Recent Labs  Lab 02/14/19 0738  NA 134*  K 4.3  CL 102  CO2 25  GLUCOSE 111*  BUN 18  CREATININE 1.14*  CALCIUM 10.0  AST 18  ALT 19  ALKPHOS 72  BILITOT 0.8   ------------------------------------------------------------------------------------------------------------------  Cardiac Enzymes No results for input(s): TROPONINI in the last 168 hours. ------------------------------------------------------------------------------------------------------------------  RADIOLOGY:  Ct Renal Stone Study  Result Date: 02/14/2019 CLINICAL DATA:  Left-sided flank pain. Fever. Nephrolithiasis. Urinary tract infection. EXAM: CT ABDOMEN AND PELVIS WITHOUT CONTRAST TECHNIQUE: Multidetector CT imaging of the abdomen and pelvis was performed following the standard protocol without IV contrast. COMPARISON:  None. FINDINGS: Lower chest: No acute findings. Hepatobiliary: No mass visualized on this unenhanced exam. Gallbladder is unremarkable. Pancreas: No mass or inflammatory process visualized on this unenhanced  exam. Spleen:  Within normal limits in size. Adrenals/Urinary tract: No evidence of urolithiasis or hydronephrosis. Mild unilateral left perinephric stranding is seen,, suspicious for pyelonephritis. Unremarkable unopacified urinary bladder. Stomach/Bowel: No evidence of obstruction, inflammatory process, or abnormal fluid collections. Normal appendix visualized. Diverticulosis is seen mainly involving the sigmoid colon, however there is no evidence of diverticulitis. Vascular/Lymphatic: No pathologically enlarged lymph nodes identified. No evidence of abdominal aortic aneurysm. Aortic atherosclerosis. Reproductive: Prior hysterectomy noted. Adnexal regions are unremarkable in appearance. Other:  None. Musculoskeletal:  No suspicious bone lesions identified. IMPRESSION: 1. Mild left perinephric stranding, suspicious for pyelonephritis. 2. No evidence of urolithiasis or hydronephrosis. 3. Colonic diverticulosis, without radiographic evidence of diverticulitis. Aortic Atherosclerosis (ICD10-I70.0). Electronically Signed   By: Earle Gell M.D.   On: 02/14/2019 09:13    EKG:   Orders placed or performed in visit on 11/11/15  . EKG 12-Lead    IMPRESSION AND PLAN:   Mary Vega  is a 73 y.o. female with a known history of hypertension, obstructive sleep apnea, history of ovarian cancer, GERD, depression and other medical problems is presenting to the ED with a chief complaint of right greater than left-sided flank pain associated with fever at home.    #Acute pyelonephritis failed outpatient antibiotics Admit to MedSurg unit IV Rocephin Follow-up on the blood and urine culture and sensitivity Hydrate with IV fluids CT renal stone study has revealed mild left perinephric stranding suspicious for pyelonephritis no hydronephrosis  #Acute kidney injury Hydrate with IV fluids and monitor renal function closely and avoid nephrotoxins  #Essential hypertension-blood pressure stable resume her home  medications amlodipine  #History of ovarian cancer outpatient follow-up with oncology for surveillance as recommended  #GERD continue home medication Pepcid  #Depression-continue home medication Zoloft  #Hyperlipidemia-continue home medication simvastatin  DVT prophylaxis with Lovenox subcu   All the records are reviewed and case discussed with ED provider. Management plans discussed with the patient, she is  in agreement.  CODE STATUS: fc   TOTAL TIME TAKING CARE OF THIS PATIENT: 43  minutes.   Note: This dictation was prepared with Dragon dictation along with smaller phrase technology. Any transcriptional errors that result from this process are unintentional.  [name] M.D on 02/14/2019 at 3:42 PM  Between 7am to 6pm - Pager - [phone]  After 6pm go to [email or web address] - password [email or web address] [facility]  Office  [phone]  CC: Primary care physician; Nicola Girt, DO

## 2019-02-14 NOTE — Progress Notes (Signed)
Dr Margaretmary Eddy requested to place orders for GI panel, C diff and enteric precautions.

## 2019-02-14 NOTE — Progress Notes (Signed)
Pharmacy Antibiotic Note  Mary Vega is a 73 y.o. female admitted on 02/14/2019 with UTI.  Pharmacy has been consulted for Rocephin dosing.  Plan: This patient's current antibiotics will be continued without adjustments. Rocephin 2gm IV once on 02/14/19, then Rocephin 1gm q24h.  Height: 5\' 5"  (165.1 cm) Weight: 175 lb (79.4 kg) IBW/kg (Calculated) : 57  Temp (24hrs), Avg:98.5 F (36.9 C), Min:98.5 F (36.9 C), Max:98.5 F (36.9 C)  Recent Labs  Lab 02/14/19 0738 02/14/19 0858  WBC 10.9*  --   CREATININE 1.14*  --   LATICACIDVEN  --  0.9    Estimated Creatinine Clearance: 45.8 mL/min (A) (by C-G formula based on SCr of 1.14 mg/dL (H)).    Allergies  Allergen Reactions  . Iodine-131 Anaphylaxis and Itching  . Ivp Dye [Iodinated Diagnostic Agents] Anaphylaxis  . Sulfa Antibiotics Hives and Other (See Comments)    Septic?  . Sulfamethoxazole Hives and Rash  . Clams [Shellfish Allergy] Diarrhea and Nausea And Vomiting  . Phenobarbital Other (See Comments)    Tremor (intolerance)    Antimicrobials this admission: Rocephin 02/14/19 >>     Microbiology results: 6/13 BCx: pending 6/13 UCx: pending   Thank you for allowing pharmacy to be a part of this patient's care.  Almando Brawley 02/14/2019 10:36 AM

## 2019-02-14 NOTE — ED Notes (Signed)
Pt given blanket per request. Denies needs at this time.

## 2019-02-14 NOTE — ED Triage Notes (Signed)
Patient ambulatory to triage with complaints of worsening UTI type sx since being treated at [facility] on Wednesday.  Pt was diagnosed Wednesday and reports fever of 101.5 at home and lower back pain.    Constant dull pain in lower back worse with percussion.  Pt also c/o nausea and poor appetite.    Pt reports taking 5 of ciprofloxacin prescribed. Speaking in complete coherent sentences. No acute breathing distress noted.

## 2019-02-14 NOTE — ED Provider Notes (Signed)
[facility] Emergency Department Provider Note  ____________________________________________   First MD Initiated Contact with Patient 02/14/19 [phone]     (approximate)  I have reviewed the triage vital signs and the nursing notes.   HISTORY  Chief Complaint Fever, Flank Pain, and Urinary Frequency    HPI Mary Vega is a 73 y.o. female here with right greater than left flank pain.  The patient states that she has had persistent dysuria for the last week.  She was seen at [facility] clinic 3 days ago and given a dose of IM Rocephin and started on Cipro.  Urinalysis was consistent with UTI at that time.  Since then, she is had persistently worsening aching, gnawing, right flank pain with associated nausea.  She said poor appetite and has had essentially one meal over the last 3 days.  She had progressively worsening weakness and difficulty eating and drinking.  She states that she spiked a fever to 101.6 at home yesterday, which is the highest her fever is been.  She subsequent presents for evaluation.  She has persistent dysuria.  She has, however, been able to keep her antibiotics down.  No other complaints.  She does have history of kidney stones, but denies history of recurrent kidney infections.        Past Medical History:  Diagnosis Date   Anxiety    takes Lorazepam daily   Chronic lower back pain    buldging disc   Constipation    takes Stool Softener daily as needed   Depression    takes Zoloft daily   GERD (gastroesophageal reflux disease)    takes Omeprazole daily   History of blood transfusion    "when I had cancer surgery; hysterectomy"   History of bronchitis    Sept 2016   History of colon polyps    benign   Hyperlipidemia    takes Simvastatin daily   Hypertension    takes Losartan daily    Insomnia    takes Trazodone nightly   Kidney stone    "must have passed it"   OSA on CPAP    Ovarian cancer ([place])     Peripheral edema    takes Spironolactone daily   Peripheral neuropathy    Pneumonia ~ 2012   PONV (postoperative nausea and vomiting)    Sinus headache    Urinary frequency    UTI (lower urinary tract infection)    being treated with Macrobid,completed today 11/11/15    Patient Active Problem List   Diagnosis Date Noted   Thyroid mass 11/17/2015    Past Surgical History:  Procedure Laterality Date   ABDOMINAL HYSTERECTOMY     CATARACT EXTRACTION W/ INTRAOCULAR LENS  IMPLANT, BILATERAL Bilateral    COLONOSCOPY     DIAGNOSTIC LAPAROSCOPY     to drain cyst on ovary   THYROID LOBECTOMY Right 11/17/2015   THYROID SURGERY  X 2   goiter removed   THYROIDECTOMY Right 11/17/2015   Procedure: RIGHT THYROID LOBECTOMY WITH FROZEN SECTION;  Surgeon: Melissa Montane, MD;  Location: [facility];  Service: ENT;  Laterality: Right;    Prior to Admission medications   Medication Sig Start Date End Date Taking? Authorizing Provider  amLODipine (NORVASC) 2.5 MG tablet Take 2.5 mg by mouth daily.   Yes [provider]  ciprofloxacin (CIPRO) 500 MG tablet Take 500 mg by mouth 2 (two) times daily.   Yes [provider]  docusate sodium (COLACE) 100 MG capsule Take  100 mg by mouth daily as needed for mild constipation.   Yes [provider]  famotidine (PEPCID) 20 MG tablet Take 20 mg by mouth 2 (two) times daily.   Yes [provider]  LORazepam (ATIVAN) 1 MG tablet Take 1 mg by mouth every 6 (six) hours as needed for anxiety.   Yes [provider]  Multiple Vitamin (MULTIVITAMIN WITH MINERALS) TABS tablet Take 1 tablet by mouth daily.   Yes [provider]  sertraline (ZOLOFT) 100 MG tablet Take 100 mg by mouth daily.   Yes [provider]  simvastatin (ZOCOR) 40 MG tablet Take 40 mg by mouth at bedtime.    Yes [provider]  spironolactone (ALDACTONE) 25 MG tablet Take 25 mg by mouth 2 (two) times daily.    Yes [provider]  telmisartan (MICARDIS) 80 MG tablet Take 80 mg by mouth daily.   Yes [provider]  traZODone (DESYREL) 100 MG tablet Take 200 mg by mouth at bedtime.   Yes [provider]    Allergies Iodine-131, Ivp dye [iodinated diagnostic agents], Sulfa antibiotics, Sulfamethoxazole, Clams [shellfish allergy], and Phenobarbital  History reviewed. No pertinent family history.  Social History Social History   Tobacco Use   Smoking status: Former Smoker    Packs/day: 0.50    Years: 47.00    Pack years: 23.50    Types: Cigarettes   Smokeless tobacco: Never Used   Tobacco comment: quit smoking in ~ 2012  Substance Use Topics   Alcohol use: No   Drug use: No    Review of Systems    Review of Systems  Constitutional: Positive for fatigue. Negative for chills and fever.  HENT: Negative for congestion and rhinorrhea.   Eyes: Negative for visual disturbance.  Respiratory: Negative for cough, chest tightness and shortness of breath.   Cardiovascular: Negative for chest pain and leg swelling.  Gastrointestinal: Positive for abdominal pain, nausea and vomiting. Negative for diarrhea.  Genitourinary: Positive for dysuria and flank pain.  Musculoskeletal: Negative for neck pain and neck stiffness.  Skin: Negative for rash.  Allergic/Immunologic: Negative for immunocompromised state.  Neurological: Positive for weakness.  All other systems reviewed and are negative.    ____________________________________________  PHYSICAL EXAM:      VITAL SIGNS: ED Triage Vitals  Enc Vitals Group     BP 02/14/19 0655 (!) 155/62     Pulse Rate 02/14/19 0655 78     Resp 02/14/19 0655 18     Temp 02/14/19 0655 98.5 F (36.9 C)     Temp Source 02/14/19 0655 Oral     SpO2 02/14/19 0655 97 %     Weight 02/14/19 0656 175 lb (79.4 kg)     Height 02/14/19 0656 5\' 5"  (1.651 m)     Head Circumference --      Peak Flow --      Pain Score 02/14/19 0656 4     Pain Loc --       Pain Edu? --      Excl. in [place]? --      Physical Exam Vitals signs and nursing note reviewed.  Constitutional:      General: She is not in acute distress.    Appearance: She is well-developed.  HENT:     Head: Normocephalic and atraumatic.     Mouth/Throat:     Mouth: Mucous membranes are dry.  Eyes:     Conjunctiva/sclera: Conjunctivae normal.  Neck:  Musculoskeletal: Neck supple.  Cardiovascular:     Rate and Rhythm: Normal rate and regular rhythm.     Heart sounds: Normal heart sounds. No murmur. No friction rub.  Pulmonary:     Effort: Pulmonary effort is normal. No respiratory distress.     Breath sounds: Normal breath sounds. No wheezing or rales.  Abdominal:     General: There is no distension.     Palpations: Abdomen is soft.     Tenderness: There is no abdominal tenderness.     Comments: Right greater than left CVA tenderness, no rebound or guarding.  Mild suprapubic tenderness.  No distention.  Skin:    General: Skin is warm.     Capillary Refill: Capillary refill takes less than 2 seconds.  Neurological:     Mental Status: She is alert and oriented to person, place, and time.     Motor: No abnormal muscle tone.       ____________________________________________   LABS (all labs ordered are listed, but only abnormal results are displayed)  Labs Reviewed  URINALYSIS, COMPLETE (UACMP) WITH MICROSCOPIC - Abnormal; Notable for the following components:      Result Value   Color, Urine YELLOW (*)    APPearance CLEAR (*)    Hgb urine dipstick SMALL (*)    All other components within normal limits  COMPREHENSIVE METABOLIC PANEL - Abnormal; Notable for the following components:   Sodium 134 (*)    Glucose, Bld 111 (*)    Creatinine, Ser 1.14 (*)    GFR calc non Af Amer 48 (*)    GFR calc Af Amer 55 (*)    All other components within normal limits  CBC WITH DIFFERENTIAL/PLATELET - Abnormal; Notable for the following components:   WBC 10.9 (*)     Neutro Abs 8.2 (*)    Monocytes Absolute 1.2 (*)    All other components within normal limits  URINE CULTURE  CULTURE, BLOOD (ROUTINE X 2)  CULTURE, BLOOD (ROUTINE X 2)  SARS CORONAVIRUS 2 (HOSPITAL ORDER, [facility])  LACTIC ACID, PLASMA  LACTIC ACID, PLASMA    ____________________________________________  EKG: NA ________________________________________  RADIOLOGY All imaging, including plain films, CT scans, and ultrasounds, independently reviewed by me, and interpretations confirmed via formal radiology reads.  ED MD interpretation:   CT: No stones, L pyelo noted, no other acute pathology.  Official radiology report(s): Ct Renal Stone Study  Result Date: 02/14/2019 CLINICAL DATA:  Left-sided flank pain. Fever. Nephrolithiasis. Urinary tract infection. EXAM: CT ABDOMEN AND PELVIS WITHOUT CONTRAST TECHNIQUE: Multidetector CT imaging of the abdomen and pelvis was performed following the standard protocol without IV contrast. COMPARISON:  None. FINDINGS: Lower chest: No acute findings. Hepatobiliary: No mass visualized on this unenhanced exam. Gallbladder is unremarkable. Pancreas: No mass or inflammatory process visualized on this unenhanced exam. Spleen:  Within normal limits in size. Adrenals/Urinary tract: No evidence of urolithiasis or hydronephrosis. Mild unilateral left perinephric stranding is seen,, suspicious for pyelonephritis. Unremarkable unopacified urinary bladder. Stomach/Bowel: No evidence of obstruction, inflammatory process, or abnormal fluid collections. Normal appendix visualized. Diverticulosis is seen mainly involving the sigmoid colon, however there is no evidence of diverticulitis. Vascular/Lymphatic: No pathologically enlarged lymph nodes identified. No evidence of abdominal aortic aneurysm. Aortic atherosclerosis. Reproductive: Prior hysterectomy noted. Adnexal regions are unremarkable in appearance. Other:  None. Musculoskeletal:  No  suspicious bone lesions identified. IMPRESSION: 1. Mild left perinephric stranding, suspicious for pyelonephritis. 2. No evidence of urolithiasis or hydronephrosis. 3.  Colonic diverticulosis, without radiographic evidence of diverticulitis. Aortic Atherosclerosis (ICD10-I70.0). Electronically Signed   By: Earle Gell M.D.   On: 02/14/2019 09:13    ____________________________________________  PROCEDURES   Procedure(s) performed (including Critical Care):  Procedures  ____________________________________________  INITIAL IMPRESSION / MDM / [place] / ED COURSE  As part of my medical decision making, I reviewed the following data within the electronic MEDICAL RECORD NUMBER Notes from prior ED visits and [place] Controlled Substance Database      *Halea A Wagler was evaluated in Emergency Department on 02/14/2019 for the symptoms described in the history of present illness. She was evaluated in the context of the global COVID-19 pandemic, which necessitated consideration that the patient might be at risk for infection with the SARS-CoV-2 virus that causes COVID-19. Institutional protocols and algorithms that pertain to the evaluation of patients at risk for COVID-19 are in a state of rapid change based on information released by regulatory bodies including the CDC and federal and state organizations. These policies and algorithms were followed during the patient's care in the ED.  Some ED evaluations and interventions may be delayed as a result of limited staffing during the pandemic.*   Clinical Course as of Feb 14 927  Sat Feb 14, 6856  [id number] 73 year old female here with persistent nausea, fatigue, and elevating fevers despite outpatient Rocephin and ciprofloxacin.  Patient appears to feel unwell but is nontoxic on exam.  She does have a persistent leukocytosis.  Renal function is normal.  Urinalysis is consistent with ongoing, but likely improving UTI.  However, clinically, she has symptoms  to suggest ongoing and possibly worsening pyelonephritis.  While she has no signs of severe sepsis, given her age, will likely need admission.  IV Rocephin and fluids ordered.  CT stone study pending.   [CI]  [id number] CT scan reviewed, is consistent with uncomplicated pyelonephritis with no stones.  Admit to medicine.   [CI]    Clinical Course User Index [CI] Duffy Bruce, MD    Medical Decision Making: See above.  Ongoing pyelonephritis with persistent fevers, nausea, and difficulty eating and drinking despite outpatient Rocephin IM and Cipro.  Unable to find urine culture on review of outside records.  Urine culture sent, antibiotics started.  ____________________________________________  FINAL CLINICAL IMPRESSION(S) / ED DIAGNOSES  Final diagnoses:  Pyelonephritis     MEDICATIONS GIVEN DURING THIS VISIT:  Medications  cefTRIAXone (ROCEPHIN) 2 g in sodium chloride 0.9 % 100 mL IVPB (2 g Intravenous New Bag/Given 02/14/19 0909)  sodium chloride 0.9 % bolus 1,000 mL (1,000 mLs Intravenous New Bag/Given 02/14/19 0908)  ondansetron (ZOFRAN) injection 4 mg (4 mg Intravenous Given 02/14/19 0908)     ED Discharge Orders    None       Note:  This document was prepared using Dragon voice recognition software and may include unintentional dictation errors.   Duffy Bruce, MD 02/14/19 [phone]

## 2019-02-14 NOTE — ED Notes (Signed)
Pt reports sxs of UTI that began about 5-6 weeks ago, but improved, sxs began again and was seen at [facility]  And started on CIPRO, pt states she began running fevers at home and c/o nausea and decreased appetite. Pt alert and oriented, states she continues to have urgency and frequency, but is improving. Pt has hx of small kidney stone years ago Pt also reports flank pain worse on the left.

## 2019-02-14 NOTE — Plan of Care (Signed)
Pt admitted today from the ED. VSS. Tylenol given once for headache with improvement.  Pt c/o nausea on arrival to floor. Phenergan ordered, but pt verbalized that she is no longer nauseated.  Enteric precautions initiated to r/o C.diff.  No BM yet.

## 2019-02-14 NOTE — Progress Notes (Signed)
Per protocol, Lovenox increased to 40mg  daily.

## 2019-02-14 NOTE — ED Notes (Signed)
Patient transported to CT 

## 2019-02-14 NOTE — Progress Notes (Signed)
Family Meeting Note  Advance Directive:yes  Today a meeting took place with the Patient.     The following clinical team members were present during this meeting:MD  The following were discussed:Patient's diagnosis: Acute pyelonephritis , HTN, gerd, depression , hyperlipidemia , treatment plan of care discussed with the pt    Patient's progosis: Unable to determine and Goals for treatment: Full Code, husband is HCPOA  Additional follow-up to be provided: HOSPITALIST  Time spent during discussion:17 MIN  [name], MD

## 2019-02-15 LAB — COMPREHENSIVE METABOLIC PANEL
ALT: 25 U/L (ref 0–44)
AST: 26 U/L (ref 15–41)
Albumin: 3.4 g/dL — ABNORMAL LOW (ref 3.5–5.0)
Alkaline Phosphatase: 61 U/L (ref 38–126)
Anion gap: 7 (ref 5–15)
BUN: 14 mg/dL (ref 8–23)
CO2: 22 mmol/L (ref 22–32)
Calcium: 9.1 mg/dL (ref 8.9–10.3)
Chloride: 109 mmol/L (ref 98–111)
Creatinine, Ser: 0.91 mg/dL (ref 0.44–1.00)
GFR calc Af Amer: >60 mL/min (ref >60)
GFR calc non Af Amer: >60 mL/min (ref >60)
Glucose, Bld: 90 mg/dL (ref 70–99)
Potassium: 4 mmol/L (ref 3.5–5.1)
Sodium: 138 mmol/L (ref 135–145)
Total Bilirubin: 0.5 mg/dL (ref 0.3–1.2)
Total Protein: 6.3 g/dL — ABNORMAL LOW (ref 6.5–8.1)

## 2019-02-15 LAB — GASTROINTESTINAL PANEL BY PCR, STOOL (REPLACES STOOL CULTURE)

## 2019-02-15 LAB — CBC
HCT: 32.3 % — ABNORMAL LOW (ref 36.0–46.0)
Hemoglobin: 10.9 g/dL — ABNORMAL LOW (ref 12.0–15.0)
MCH: 30.4 pg (ref 26.0–34.0)
MCHC: 33.7 g/dL (ref 30.0–36.0)
MCV: 90 fL (ref 80.0–100.0)
Platelets: 154 10*3/uL (ref 150–400)
RBC: 3.59 MIL/uL — ABNORMAL LOW (ref 3.87–5.11)
RDW: 12.4 % (ref 11.5–15.5)
WBC: 6.4 10*3/uL (ref 4.0–10.5)
nRBC: 0 % (ref 0.0–0.2)

## 2019-02-15 LAB — URINE CULTURE: Culture: NO GROWTH

## 2019-02-15 LAB — C DIFFICILE QUICK SCREEN W PCR REFLEX
C Diff antigen: NEGATIVE
C Diff interpretation: NOT DETECTED
C Diff toxin: NEGATIVE

## 2019-02-15 MED ORDER — IRBESARTAN 150 MG PO TABS
300.0000 mg | ORAL_TABLET | Freq: Every day | ORAL | Status: DC
  Administered 2019-02-15 – 2019-02-16 (×2): 300 mg via ORAL
  Filled 2019-02-15 (×2): qty 2

## 2019-02-15 NOTE — Plan of Care (Signed)
Pt denies pain/n/v.  Pt eat better today. PO fluids encouraged. IVF d/c. Enteric precautions d/c. C diff and GI panel tests were negative.

## 2019-02-15 NOTE — Progress Notes (Signed)
Mary Vega at [place] NAME: Mary Vega    MR#:  [id number]  DATE OF BIRTH:  [birth year]  SUBJECTIVE:  CHIEF COMPLAINT: Patient's diarrhea resolved.  Feeling better Flank pain is improving  REVIEW OF SYSTEMS:  CONSTITUTIONAL: No fever, fatigue or weakness.  EYES: No blurred or double vision.  EARS, NOSE, AND THROAT: No tinnitus or ear pain.  RESPIRATORY: No cough, shortness of breath, wheezing or hemoptysis.  CARDIOVASCULAR: No chest pain, orthopnea, edema.  GASTROINTESTINAL: No nausea, vomiting, diarrhea or abdominal pain.  GENITOURINARY: No dysuria, hematuria.  ENDOCRINE: No polyuria, nocturia,  HEMATOLOGY: No anemia, easy bruising or bleeding SKIN: No rash or lesion. MUSCULOSKELETAL: No joint pain or arthritis.   NEUROLOGIC: No tingling, numbness, weakness.  PSYCHIATRY: No anxiety or depression.   DRUG ALLERGIES:   Allergies  Allergen Reactions  . Iodine-131 Anaphylaxis and Itching  . Ivp Dye [Iodinated Diagnostic Agents] Anaphylaxis  . Sulfa Antibiotics Hives and Other (See Comments)    Septic?  . Sulfamethoxazole Hives and Rash  . Clams [Shellfish Allergy] Diarrhea and Nausea And Vomiting  . Phenobarbital Other (See Comments)    Tremor (intolerance)    VITALS:  Blood pressure (!) 141/60, pulse 60, temperature 98.8 F (37.1 C), temperature source Oral, resp. rate 18, height 5\' 5"  (1.651 m), weight 79.4 kg, SpO2 96 %.  PHYSICAL EXAMINATION:  GENERAL:  73 y.o.-year-old patient lying in the bed with no acute distress.  EYES: Pupils equal, round, reactive to light and accommodation. No scleral icterus. Extraocular muscles intact.  HEENT: Head atraumatic, normocephalic. Oropharynx and nasopharynx clear.  NECK:  Supple, no jugular venous distention. No thyroid enlargement, no tenderness.  LUNGS: Normal breath sounds bilaterally, no wheezing, rales,rhonchi or crepitation. No use of accessory muscles of respiration.   CARDIOVASCULAR: S1, S2 normal. No murmurs, rubs, or gallops.  ABDOMEN: Soft, nontender, nondistended. Bowel sounds present.  EXTREMITIES: No pedal edema, cyanosis, or clubbing.  NEUROLOGIC: Cranial nerves II through XII are intact. Muscle strength 5/5 in all extremities. Sensation intact. Gait not checked.  PSYCHIATRIC: The patient is alert and oriented x 3.  SKIN: No obvious rash, lesion, or ulcer.    LABORATORY PANEL:   CBC Recent Labs  Lab 02/15/19 0440  WBC 6.4  HGB 10.9*  HCT 32.3*  PLT 154   ------------------------------------------------------------------------------------------------------------------  Chemistries  Recent Labs  Lab 02/15/19 0440  NA 138  K 4.0  CL 109  CO2 22  GLUCOSE 90  BUN 14  CREATININE 0.91  CALCIUM 9.1  AST 26  ALT 25  ALKPHOS 61  BILITOT 0.5   ------------------------------------------------------------------------------------------------------------------  Cardiac Enzymes No results for input(s): TROPONINI in the last 168 hours. ------------------------------------------------------------------------------------------------------------------  RADIOLOGY:  Ct Renal Stone Study  Result Date: 02/14/2019 CLINICAL DATA:  Left-sided flank pain. Fever. Nephrolithiasis. Urinary tract infection. EXAM: CT ABDOMEN AND PELVIS WITHOUT CONTRAST TECHNIQUE: Multidetector CT imaging of the abdomen and pelvis was performed following the standard protocol without IV contrast. COMPARISON:  None. FINDINGS: Lower chest: No acute findings. Hepatobiliary: No mass visualized on this unenhanced exam. Gallbladder is unremarkable. Pancreas: No mass or inflammatory process visualized on this unenhanced exam. Spleen:  Within normal limits in size. Adrenals/Urinary tract: No evidence of urolithiasis or hydronephrosis. Mild unilateral left perinephric stranding is seen,, suspicious for pyelonephritis. Unremarkable unopacified urinary bladder. Stomach/Bowel: No  evidence of obstruction, inflammatory process, or abnormal fluid collections. Normal appendix visualized. Diverticulosis is seen mainly involving the sigmoid colon, however there is no evidence of  diverticulitis. Vascular/Lymphatic: No pathologically enlarged lymph nodes identified. No evidence of abdominal aortic aneurysm. Aortic atherosclerosis. Reproductive: Prior hysterectomy noted. Adnexal regions are unremarkable in appearance. Other:  None. Musculoskeletal:  No suspicious bone lesions identified. IMPRESSION: 1. Mild left perinephric stranding, suspicious for pyelonephritis. 2. No evidence of urolithiasis or hydronephrosis. 3. Colonic diverticulosis, without radiographic evidence of diverticulitis. Aortic Atherosclerosis (ICD10-I70.0). Electronically Signed   By: Mary Vega M.D.   On: 02/14/2019 09:13    EKG:   Orders placed or performed in visit on 11/11/15  . EKG 12-Lead    ASSESSMENT AND PLAN:   Mary Vega  is a 73 y.o. female with a known history of hypertension, obstructive sleep apnea, history of ovarian cancer, GERD, depression and other medical problems is presenting to the ED with a chief complaint of right greater than left-sided flank pain associated with fever at home.    #Acute pyelonephritis failed outpatient antibiotics Clinically feeling better. IV Rocephin will be continued Blood cultures are negative.  Urine culture is pending Hydrated with IV fluids CT renal stone study has revealed mild left perinephric stranding suspicious for pyelonephritis no hydronephrosis  #Diarrhea resolved Stool for C. difficile toxin negative GI panel negative  #Acute kidney injury Resolved Hydrated with IV fluids and monitor renal function closely and avoid nephrotoxins  #Essential hypertension-blood pressure stable resume her home medications amlodipine  #History of ovarian cancer outpatient follow-up with oncology for surveillance as recommended  #GERD continue home  medication Pepcid  #Depression-continue home medication Zoloft  #Hyperlipidemia-continue home medication simvastatin     All the records are reviewed and case discussed with Care Management/Social Workerr. Management plans discussed with the patient, she is  in agreement.  CODE STATUS:   TOTAL TIME TAKING CARE OF THIS PATIENT: 33  minutes.   POSSIBLE D/C IN 1  DAYS, DEPENDING ON CLINICAL CONDITION.  Note: This dictation was prepared with Dragon dictation along with smaller phrase technology. Any transcriptional errors that result from this process are unintentional.   [name] M.D on 02/15/2019 at 1:20 PM  Between 7am to 6pm - Pager - [phone] After 6pm go to [email or web address] - password [email or web address] [facility] [facility]  Office  [phone]  CC: Primary care physician; Nicola Girt, DO

## 2019-02-16 MED ORDER — CEFDINIR 300 MG PO CAPS
300.0000 mg | ORAL_CAPSULE | Freq: Two times a day (BID) | ORAL | Status: DC
  Administered 2019-02-16: 09:00:00 300 mg via ORAL
  Filled 2019-02-16 (×2): qty 1

## 2019-02-16 MED ORDER — CEFDINIR 300 MG PO CAPS: 300.0000 mg | ORAL_CAPSULE | Freq: Two times a day (BID) | ORAL | 0 refills | Status: DC

## 2019-02-16 NOTE — Discharge Instructions (Signed)
Follow-up with primary care physician in 2 to 3 days

## 2019-02-16 NOTE — Discharge Summary (Signed)
[facility] [facility] NAME: Mary Vega    MR#:  [id number]  DATE OF BIRTH:  1945-09-06  DATE OF ADMISSION:  02/14/2019 ADMITTING PHYSICIAN: [name], MD  DATE OF DISCHARGE: 02/16/2019 11:57 AM  PRIMARY CARE PHYSICIAN: Nicola Girt, DO    ADMISSION DIAGNOSIS:  Pyelonephritis [N12]  DISCHARGE DIAGNOSIS:  Active Problems:   Acute pyelonephritis   SECONDARY DIAGNOSIS:   Past Medical History:  Diagnosis Date  . Anxiety    takes Lorazepam daily  . Chronic lower back pain    buldging disc  . Constipation    takes Stool Softener daily as needed  . Depression    takes Zoloft daily  . GERD (gastroesophageal reflux disease)    takes Omeprazole daily  . History of blood transfusion    "when I had cancer surgery; hysterectomy"  . History of bronchitis    Sept 2016  . History of colon polyps    benign  . Hyperlipidemia    takes Simvastatin daily  . Hypertension    takes Losartan daily   . Insomnia    takes Trazodone nightly  . Kidney stone    "must have passed it"  . OSA on CPAP   . Ovarian cancer ([place])   . Peripheral edema    takes Spironolactone daily  . Peripheral neuropathy   . Pneumonia ~ 2012  . PONV (postoperative nausea and vomiting)   . Sinus headache   . Urinary frequency   . UTI (lower urinary tract infection)    being treated with Macrobid,completed today 11/11/15    HOSPITAL COURSE:   [name] a73 y.o.femalewith a known history of hypertension, obstructive sleep apnea, history of ovarian cancer, GERD, depression and other medical problems is presenting to the ED with a chief complaint of right greater than left-sided flank pain associated with fever at home.   #Acute pyelonephritis failed outpatient antibiotics Clinically feeling better. IV Rocephin  continue during the hospital course Discharging patient home with p.o. Omnicef.  Patient is agreeable Blood cultures are negative.   Urine culture with no growth, but patient was just treated with ciprofloxacin which might be giving [place] a false negative result Hydrated with IV fluids CT renal stone study has revealed mild left perinephric stranding suspicious for pyelonephritis no hydronephrosis  #Diarrhea resolved Stool for C. difficile toxin negative GI panel negative  #Acute kidney injury Resolved Hydrated with IV fluids and monitor renal function closely and avoid nephrotoxins  #Essential hypertension-blood pressure stable resume her home medications amlodipine  #History of ovarian cancer outpatient follow-up with oncology for surveillance as recommended  #GERD continue home medication Pepcid  #Depression-continue home medication Zoloft  #Hyperlipidemia-continue home medication simvastatin  Discharge patient home  DISCHARGE CONDITIONS:   stable  CONSULTS OBTAINED:     PROCEDURES  None   DRUG ALLERGIES:   Allergies  Allergen Reactions  . Iodine-131 Anaphylaxis and Itching  . Ivp Dye [Iodinated Diagnostic Agents] Anaphylaxis  . Sulfa Antibiotics Hives and Other (See Comments)    Septic?  . Sulfamethoxazole Hives and Rash  . Clams [Shellfish Allergy] Diarrhea and Nausea And Vomiting  . Phenobarbital Other (See Comments)    Tremor (intolerance)    DISCHARGE MEDICATIONS:   Allergies as of 02/16/2019      Reactions   Iodine-131 Anaphylaxis, Itching   Ivp Dye [iodinated Diagnostic Agents] Anaphylaxis   Sulfa Antibiotics Hives, Other (See Comments)   Septic?   Sulfamethoxazole Hives, Rash   Clams [  shellfish Allergy] Diarrhea, Nausea And Vomiting   Phenobarbital Other (See Comments)   Tremor (intolerance)      Medication List    STOP taking these medications   ciprofloxacin 500 MG tablet Commonly known as: CIPRO     TAKE these medications   amLODipine 2.5 MG tablet Commonly known as: NORVASC Take 2.5 mg by mouth daily.   cefdinir 300 MG capsule Commonly known as:  OMNICEF Take 1 capsule (300 mg total) by mouth every 12 (twelve) hours.   docusate sodium 100 MG capsule Commonly known as: COLACE Take 100 mg by mouth daily as needed for mild constipation.   famotidine 20 MG tablet Commonly known as: PEPCID Take 20 mg by mouth 2 (two) times daily.   LORazepam 1 MG tablet Commonly known as: ATIVAN Take 1 mg by mouth every 6 (six) hours as needed for anxiety.   multivitamin with minerals Tabs tablet Take 1 tablet by mouth daily.   sertraline 100 MG tablet Commonly known as: ZOLOFT Take 100 mg by mouth daily.   simvastatin 40 MG tablet Commonly known as: ZOCOR Take 40 mg by mouth at bedtime.   spironolactone 25 MG tablet Commonly known as: ALDACTONE Take 25 mg by mouth 2 (two) times daily.   telmisartan 80 MG tablet Commonly known as: MICARDIS Take 80 mg by mouth daily.   traZODone 100 MG tablet Commonly known as: DESYREL Take 200 mg by mouth at bedtime.        DISCHARGE INSTRUCTIONS:     DIET:  Cardiac diet  DISCHARGE CONDITION:  Stable  ACTIVITY:  Activity as tolerated  OXYGEN:  Home Oxygen: No.   Oxygen Delivery: room air  DISCHARGE LOCATION:  home   If you experience worsening of your admission symptoms, develop shortness of breath, life threatening emergency, suicidal or homicidal thoughts you must seek medical attention immediately by calling 911 or calling your MD immediately  if symptoms less severe.  You Must read complete instructions/literature along with all the possible adverse reactions/side effects for all the Medicines you take and that have been prescribed to you. Take any new Medicines after you have completely understood and accpet all the possible adverse reactions/side effects.   Please note  You were cared for by a hospitalist during your hospital stay. If you have any questions about your discharge medications or the care you received while you were in the hospital after you are discharged,  you can call the unit and asked to speak with the hospitalist on call if the hospitalist that took care of you is not available. Once you are discharged, your primary care physician will handle any further medical issues. Please note that NO REFILLS for any discharge medications will be authorized once you are discharged, as it is imperative that you return to your primary care physician (or establish a relationship with a primary care physician if you do not have one) for your aftercare needs so that they can reassess your need for medications and monitor your lab values.     Today  Chief Complaint  Patient presents with  . Fever  . Flank Pain  . Urinary Frequency   Patient is doing fine with no nausea vomiting or back pain.  Wants to go home  ROS:  CONSTITUTIONAL: Denies fevers, chills. Denies any fatigue, weakness.  EYES: Denies blurry vision, double vision, eye pain. EARS, NOSE, THROAT: Denies tinnitus, ear pain, hearing loss. RESPIRATORY: Denies cough, wheeze, shortness of breath.  CARDIOVASCULAR: Denies chest pain,  palpitations, edema.  GASTROINTESTINAL: Denies nausea, vomiting, diarrhea, abdominal pain. Denies bright red blood per rectum. GENITOURINARY: Denies dysuria, hematuria. ENDOCRINE: Denies nocturia or thyroid problems. HEMATOLOGIC AND LYMPHATIC: Denies easy bruising or bleeding. SKIN: Denies rash or lesion. MUSCULOSKELETAL: Denies pain in neck, back, shoulder, knees, hips or arthritic symptoms.  NEUROLOGIC: Denies paralysis, paresthesias.  PSYCHIATRIC: Denies anxiety or depressive symptoms.   VITAL SIGNS:  Blood pressure (!) 152/65, pulse (!) 56, temperature 98.1 F (36.7 C), temperature source Oral, resp. rate 15, height 5\' 5"  (1.651 m), weight 79.4 kg, SpO2 96 %.  I/O:    Intake/Output Summary (Last 24 hours) at 02/16/2019 1515 Last data filed at 02/16/2019 0908 Gross per 24 hour  Intake 240 ml  Output -  Net 240 ml    PHYSICAL EXAMINATION:  GENERAL:  73  y.o.-year-old patient lying in the bed with no acute distress.  EYES: Pupils equal, round, reactive to light and accommodation. No scleral icterus. Extraocular muscles intact.  HEENT: Head atraumatic, normocephalic. Oropharynx and nasopharynx clear.  NECK:  Supple, no jugular venous distention. No thyroid enlargement, no tenderness.  LUNGS: Normal breath sounds bilaterally, no wheezing, rales,rhonchi or crepitation. No use of accessory muscles of respiration.  CARDIOVASCULAR: S1, S2 normal. No murmurs, rubs, or gallops.  ABDOMEN: Soft, non-tender, non-distended. Bowel sounds present.  EXTREMITIES: No pedal edema, cyanosis, or clubbing.  NEUROLOGIC: Cranial nerves II through XII are intact. Muscle strength 5/5 in all extremities. Sensation intact. Gait not checked.  PSYCHIATRIC: The patient is alert and oriented x 3.  SKIN: No obvious rash, lesion, or ulcer.   DATA REVIEW:   CBC Recent Labs  Lab 02/15/19 0440  WBC 6.4  HGB 10.9*  HCT 32.3*  PLT 154    Chemistries  Recent Labs  Lab 02/15/19 0440  NA 138  K 4.0  CL 109  CO2 22  GLUCOSE 90  BUN 14  CREATININE 0.91  CALCIUM 9.1  AST 26  ALT 25  ALKPHOS 61  BILITOT 0.5    Cardiac Enzymes No results for input(s): TROPONINI in the last 168 hours.  Microbiology Results  Results for orders placed or performed during the hospital encounter of 02/14/19  Urine culture     Status: None   Collection Time: 02/14/19  7:38 AM   Specimen: Urine, Random  Result Value Ref Range Status   Specimen Description   Final    URINE, RANDOM Performed at [facility], [address], [place], [place] [address]    Special Requests   Final    NONE Performed at [facility], [address] [place], [place] [address]    Culture   Final    NO GROWTH Performed at [facility], [place] [address], [place], [place] [address]    Report Status 02/15/2019 FINAL  Final  Blood culture (routine x 2)     Status: None  (Preliminary result)   Collection Time: 02/14/19  8:58 AM   Specimen: BLOOD  Result Value Ref Range Status   Specimen Description BLOOD RIGHT WRIST  Final   Special Requests   Final    BOTTLES DRAWN AEROBIC AND ANAEROBIC Blood Culture adequate volume   Culture   Final    NO GROWTH < 24 HOURS Performed at [facility], [address] [place], [place] [address]    Report Status PENDING  Incomplete  Blood culture (routine x 2)     Status: None (Preliminary result)   Collection Time: 02/14/19  8:58  AM   Specimen: BLOOD  Result Value Ref Range Status   Specimen Description BLOOD RIGHT ARM  Final   Special Requests   Final    BOTTLES DRAWN AEROBIC AND ANAEROBIC Blood Culture adequate volume   Culture   Final    NO GROWTH < 24 HOURS Performed at [facility], [address], [place], [place] [address]    Report Status PENDING  Incomplete  SARS Coronavirus 2 (CEPHEID - Performed in [place] hospital lab), Hosp Order     Status: None   Collection Time: 02/14/19  8:58 AM   Specimen: Nasopharyngeal Swab  Result Value Ref Range Status   SARS Coronavirus 2 NEGATIVE NEGATIVE Final    Comment: (NOTE) If result is NEGATIVE SARS-CoV-2 target nucleic acids are NOT DETECTED. The SARS-CoV-2 RNA is generally detectable in upper and lower  respiratory specimens during the acute phase of infection. The lowest  concentration of SARS-CoV-2 viral copies this assay can detect is 250  copies / mL. A negative result does not preclude SARS-CoV-2 infection  and should not be used as the sole basis for treatment or other  patient management decisions.  A negative result may occur with  improper specimen collection / handling, submission of specimen other  than nasopharyngeal swab, presence of viral mutation(s) within the  areas targeted by this assay, and inadequate number of viral copies  (<250 copies / mL). A negative result must be combined with clinical  observations, patient  history, and epidemiological information. If result is POSITIVE SARS-CoV-2 target nucleic acids are DETECTED. The SARS-CoV-2 RNA is generally detectable in upper and lower  respiratory specimens dur ing the acute phase of infection.  Positive  results are indicative of active infection with SARS-CoV-2.  Clinical  correlation with patient history and other diagnostic information is  necessary to determine patient infection status.  Positive results do  not rule out bacterial infection or co-infection with other viruses. If result is PRESUMPTIVE POSTIVE SARS-CoV-2 nucleic acids MAY BE PRESENT.   A presumptive positive result was obtained on the submitted specimen  and confirmed on repeat testing.  While 2019 novel coronavirus  (SARS-CoV-2) nucleic acids may be present in the submitted sample  additional confirmatory testing may be necessary for epidemiological  and / or clinical management purposes  to differentiate between  SARS-CoV-2 and other Sarbecovirus currently known to infect humans.  If clinically indicated additional testing with an alternate test  methodology [phone]) is advised. The SARS-CoV-2 RNA is generally  detectable in upper and lower respiratory sp ecimens during the acute  phase of infection. The expected result is Negative. Fact Sheet for Patients:  StrictlyIdeas.no Fact Sheet for Healthcare Providers: BankingDealers.co.za This test is not yet approved or cleared by the [place] FDA and has been authorized for detection and/or diagnosis of SARS-CoV-2 by FDA under an Emergency Use Authorization (EUA).  This EUA will remain in effect (meaning this test can be used) for the duration of the COVID-19 declaration under Section 564(b)(1) of the Act, 21 U.S.C. section 360bbb-3(b)(1), unless the authorization is terminated or revoked sooner. Performed at [facility], [address] [place], [place]  [address]   C difficile quick scan w PCR reflex     Status: None   Collection Time: 02/15/19 10:00 AM   Specimen: STOOL  Result Value Ref Range Status   C Diff antigen NEGATIVE NEGATIVE Final   C Diff toxin NEGATIVE NEGATIVE Final   C Diff interpretation No C. difficile  detected.  Final    Comment: Performed at [facility], [address] [place], [place] [address]  Gastrointestinal Panel by PCR , Stool     Status: None   Collection Time: 02/15/19 10:00 AM   Specimen: STOOL  Result Value Ref Range Status   Campylobacter species NOT DETECTED NOT DETECTED Final   Plesimonas shigelloides NOT DETECTED NOT DETECTED Final   Salmonella species NOT DETECTED NOT DETECTED Final   Yersinia enterocolitica NOT DETECTED NOT DETECTED Final   Vibrio species NOT DETECTED NOT DETECTED Final   Vibrio cholerae NOT DETECTED NOT DETECTED Final   Enteroaggregative E coli (EAEC) NOT DETECTED NOT DETECTED Final   Enteropathogenic E coli (EPEC) NOT DETECTED NOT DETECTED Final   Enterotoxigenic E coli (ETEC) NOT DETECTED NOT DETECTED Final   Shiga like toxin producing E coli (STEC) NOT DETECTED NOT DETECTED Final   Shigella/Enteroinvasive E coli (EIEC) NOT DETECTED NOT DETECTED Final   Cryptosporidium NOT DETECTED NOT DETECTED Final   Cyclospora cayetanensis NOT DETECTED NOT DETECTED Final   Entamoeba histolytica NOT DETECTED NOT DETECTED Final   Giardia lamblia NOT DETECTED NOT DETECTED Final   Adenovirus F40/41 NOT DETECTED NOT DETECTED Final   Astrovirus NOT DETECTED NOT DETECTED Final   Norovirus GI/GII NOT DETECTED NOT DETECTED Final   Rotavirus A NOT DETECTED NOT DETECTED Final   Sapovirus (I, II, IV, and V) NOT DETECTED NOT DETECTED Final    Comment: Performed at [facility], [address] [place], [place] [address]    RADIOLOGY:  Ct Renal Stone Study  Result Date: 02/14/2019 CLINICAL DATA:  Left-sided flank pain. Fever. Nephrolithiasis. Urinary tract infection. EXAM: CT  ABDOMEN AND PELVIS WITHOUT CONTRAST TECHNIQUE: Multidetector CT imaging of the abdomen and pelvis was performed following the standard protocol without IV contrast. COMPARISON:  None. FINDINGS: Lower chest: No acute findings. Hepatobiliary: No mass visualized on this unenhanced exam. Gallbladder is unremarkable. Pancreas: No mass or inflammatory process visualized on this unenhanced exam. Spleen:  Within normal limits in size. Adrenals/Urinary tract: No evidence of urolithiasis or hydronephrosis. Mild unilateral left perinephric stranding is seen,, suspicious for pyelonephritis. Unremarkable unopacified urinary bladder. Stomach/Bowel: No evidence of obstruction, inflammatory process, or abnormal fluid collections. Normal appendix visualized. Diverticulosis is seen mainly involving the sigmoid colon, however there is no evidence of diverticulitis. Vascular/Lymphatic: No pathologically enlarged lymph nodes identified. No evidence of abdominal aortic aneurysm. Aortic atherosclerosis. Reproductive: Prior hysterectomy noted. Adnexal regions are unremarkable in appearance. Other:  None. Musculoskeletal:  No suspicious bone lesions identified. IMPRESSION: 1. Mild left perinephric stranding, suspicious for pyelonephritis. 2. No evidence of urolithiasis or hydronephrosis. 3. Colonic diverticulosis, without radiographic evidence of diverticulitis. Aortic Atherosclerosis (ICD10-I70.0). Electronically Signed   By: Earle Gell M.D.   On: 02/14/2019 09:13    EKG:   Orders placed or performed in visit on 11/11/15  . EKG 12-Lead      Management plans discussed with the patient, she is in agreement.  CODE STATUS:  Code Status History    Date Active Date Inactive Code Status Order ID Comments User Context   02/14/2019 1221 02/16/2019 1502 Full Code [id number]  [name], MD Inpatient   11/17/2015 1433 11/19/2015 1644 Full Code [id number]  Melissa Montane, MD Inpatient   Advance Care Planning Activity      TOTAL TIME  TAKING CARE OF THIS PATIENT: 43  minutes.   Note: This dictation was prepared with Dragon dictation along with smaller phrase technology. Any transcriptional errors that result from  this process are unintentional.   @[facility] @  on 02/16/2019 at 3:15 PM  Between 7am to 6pm - Pager - [phone]  After 6pm go to [email or web address] - password [email or web address]  Office  [phone]  CC: Primary care physician; Nicola Girt, DO

## 2019-02-16 NOTE — Progress Notes (Signed)
MD order received in [facility] to discharge pt home today; verbally reviewed AVS with pt, Rx escribed to [facility] on [address] in [place], [place]; no questions voiced at this time; verbally instructed pt to call when her ride has arrived at the [facility] entrance

## 2019-02-16 NOTE — Progress Notes (Signed)
Pt concerned that she hasn't received her spironolactone and telmisartin since she has been here.  Contacted MD.  Mary Vega has irbesartan comparable to hers, and that was ordered.  The spironolactone was held for now because it is potassium sparing, and her levels are perfect.  No need to elevate the K if unnecessary..per orders MD.

## 2019-02-16 NOTE — Progress Notes (Signed)
Pt's ride present at the [facility] entrance; pt discharged via wheelchair by nursing to the visitor's entrance

## 2019-02-19 LAB — CULTURE, BLOOD (ROUTINE X 2)
Culture: NO GROWTH
Culture: NO GROWTH
Special Requests: ADEQUATE
Special Requests: ADEQUATE

## 2019-02-20 DIAGNOSIS — Z87448 Personal history of other diseases of urinary system: Secondary | ICD-10-CM | POA: Insufficient documentation

## 2019-02-20 DIAGNOSIS — Z9189 Other specified personal risk factors, not elsewhere classified: Secondary | ICD-10-CM | POA: Insufficient documentation

## 2019-03-09 ENCOUNTER — Inpatient Hospital Stay
Admission: EM | Admit: 2019-03-09 | Discharge: 2019-03-11 | DRG: 690 | Disposition: A | Discharge status: Home / Self Care | Payer: Medicare Other | Attending: Internal Medicine | Admitting: Internal Medicine

## 2019-03-09 ENCOUNTER — Encounter: Payer: Self-pay | Admitting: Emergency Medicine

## 2019-03-09 ENCOUNTER — Other Ambulatory Visit: Payer: Self-pay

## 2019-03-09 ENCOUNTER — Inpatient Hospital Stay: Payer: Medicare Other

## 2019-03-09 DIAGNOSIS — Z87891 Personal history of nicotine dependence: Secondary | ICD-10-CM

## 2019-03-09 DIAGNOSIS — K219 Gastro-esophageal reflux disease without esophagitis: Secondary | ICD-10-CM | POA: Diagnosis present

## 2019-03-09 DIAGNOSIS — R531 Weakness: Secondary | ICD-10-CM

## 2019-03-09 DIAGNOSIS — G629 Polyneuropathy, unspecified: Secondary | ICD-10-CM | POA: Diagnosis present

## 2019-03-09 DIAGNOSIS — N3001 Acute cystitis with hematuria: Secondary | ICD-10-CM | POA: Diagnosis not present

## 2019-03-09 DIAGNOSIS — Z9089 Acquired absence of other organs: Secondary | ICD-10-CM

## 2019-03-09 DIAGNOSIS — Z20828 Contact with and (suspected) exposure to other viral communicable diseases: Secondary | ICD-10-CM | POA: Diagnosis present

## 2019-03-09 DIAGNOSIS — G4700 Insomnia, unspecified: Secondary | ICD-10-CM | POA: Diagnosis present

## 2019-03-09 DIAGNOSIS — E785 Hyperlipidemia, unspecified: Secondary | ICD-10-CM | POA: Diagnosis present

## 2019-03-09 DIAGNOSIS — Z79899 Other long term (current) drug therapy: Secondary | ICD-10-CM

## 2019-03-09 DIAGNOSIS — I10 Essential (primary) hypertension: Secondary | ICD-10-CM | POA: Diagnosis present

## 2019-03-09 DIAGNOSIS — Z87440 Personal history of urinary (tract) infections: Secondary | ICD-10-CM | POA: Diagnosis not present

## 2019-03-09 DIAGNOSIS — K5900 Constipation, unspecified: Secondary | ICD-10-CM | POA: Diagnosis present

## 2019-03-09 DIAGNOSIS — R300 Dysuria: Secondary | ICD-10-CM | POA: Diagnosis not present

## 2019-03-09 DIAGNOSIS — M545 Low back pain: Secondary | ICD-10-CM | POA: Diagnosis present

## 2019-03-09 DIAGNOSIS — Z8543 Personal history of malignant neoplasm of ovary: Secondary | ICD-10-CM

## 2019-03-09 DIAGNOSIS — G4733 Obstructive sleep apnea (adult) (pediatric): Secondary | ICD-10-CM | POA: Diagnosis present

## 2019-03-09 DIAGNOSIS — F418 Other specified anxiety disorders: Secondary | ICD-10-CM | POA: Diagnosis present

## 2019-03-09 DIAGNOSIS — N390 Urinary tract infection, site not specified: Secondary | ICD-10-CM | POA: Diagnosis present

## 2019-03-09 DIAGNOSIS — Z888 Allergy status to other drugs, medicaments and biological substances status: Secondary | ICD-10-CM | POA: Diagnosis not present

## 2019-03-09 DIAGNOSIS — G8929 Other chronic pain: Secondary | ICD-10-CM | POA: Diagnosis present

## 2019-03-09 DIAGNOSIS — B9620 Unspecified Escherichia coli [E. coli] as the cause of diseases classified elsewhere: Secondary | ICD-10-CM | POA: Diagnosis present

## 2019-03-09 DIAGNOSIS — R109 Unspecified abdominal pain: Secondary | ICD-10-CM

## 2019-03-09 DIAGNOSIS — Z91041 Radiographic dye allergy status: Secondary | ICD-10-CM | POA: Diagnosis not present

## 2019-03-09 DIAGNOSIS — Z882 Allergy status to sulfonamides status: Secondary | ICD-10-CM | POA: Diagnosis not present

## 2019-03-09 DIAGNOSIS — Z90710 Acquired absence of both cervix and uterus: Secondary | ICD-10-CM

## 2019-03-09 LAB — CBC
HCT: 40 % (ref 36.0–46.0)
Hemoglobin: 13.4 g/dL (ref 12.0–15.0)
MCH: 30.2 pg (ref 26.0–34.0)
MCHC: 33.5 g/dL (ref 30.0–36.0)
MCV: 90.1 fL (ref 80.0–100.0)
Platelets: 189 10*3/uL (ref 150–400)
RBC: 4.44 MIL/uL (ref 3.87–5.11)
RDW: 12.8 % (ref 11.5–15.5)
WBC: 10.9 10*3/uL — ABNORMAL HIGH (ref 4.0–10.5)
nRBC: 0 % (ref 0.0–0.2)

## 2019-03-09 LAB — COMPREHENSIVE METABOLIC PANEL
ALT: 30 U/L (ref 0–44)
AST: 19 U/L (ref 15–41)
Albumin: 4.3 g/dL (ref 3.5–5.0)
Alkaline Phosphatase: 90 U/L (ref 38–126)
Anion gap: 8 (ref 5–15)
BUN: 16 mg/dL (ref 8–23)
CO2: 25 mmol/L (ref 22–32)
Calcium: 10.5 mg/dL — ABNORMAL HIGH (ref 8.9–10.3)
Chloride: 109 mmol/L (ref 98–111)
Creatinine, Ser: 0.97 mg/dL (ref 0.44–1.00)
GFR calc Af Amer: >60 mL/min (ref >60)
GFR calc non Af Amer: 58 mL/min — ABNORMAL LOW (ref >60)
Glucose, Bld: 128 mg/dL — ABNORMAL HIGH (ref 70–99)
Potassium: 4.5 mmol/L (ref 3.5–5.1)
Sodium: 142 mmol/L (ref 135–145)
Total Bilirubin: 0.6 mg/dL (ref 0.3–1.2)
Total Protein: 7.7 g/dL (ref 6.5–8.1)

## 2019-03-09 LAB — URINALYSIS, COMPLETE (UACMP) WITH MICROSCOPIC
Bilirubin Urine: NEGATIVE
Glucose, UA: NEGATIVE mg/dL
Ketones, ur: NEGATIVE mg/dL
Nitrite: NEGATIVE
Protein, ur: 30 mg/dL — AB
RBC / HPF: >50 RBC/hpf — ABNORMAL HIGH (ref 0–5)
Specific Gravity, Urine: 1.008 (ref 1.005–1.030)
WBC, UA: >50 WBC/hpf — ABNORMAL HIGH (ref 0–5)
pH: 6 (ref 5.0–8.0)

## 2019-03-09 LAB — SARS CORONAVIRUS 2 BY RT PCR (HOSPITAL ORDER, PERFORMED IN ~~LOC~~ HOSPITAL LAB): SARS Coronavirus 2: NEGATIVE

## 2019-03-09 LAB — LIPASE, BLOOD: Lipase: 31 U/L (ref 11–51)

## 2019-03-09 MED ORDER — SODIUM CHLORIDE 0.9 % IV SOLN
INTRAVENOUS | Status: DC
  Administered 2019-03-09 – 2019-03-11 (×4): via INTRAVENOUS

## 2019-03-09 MED ORDER — AMLODIPINE BESYLATE 5 MG PO TABS
2.5000 mg | ORAL_TABLET | Freq: Every day | ORAL | Status: DC
  Administered 2019-03-09 – 2019-03-11 (×3): 2.5 mg via ORAL
  Filled 2019-03-09 (×3): qty 1

## 2019-03-09 MED ORDER — MORPHINE SULFATE (PF) 2 MG/ML IV SOLN: 1.0000 mg | INTRAVENOUS | Status: DC | PRN

## 2019-03-09 MED ORDER — KETOROLAC TROMETHAMINE 30 MG/ML IJ SOLN
15.0000 mg | Freq: Once | INTRAMUSCULAR | Status: AC
  Administered 2019-03-09: 09:00:00 15 mg via INTRAVENOUS

## 2019-03-09 MED ORDER — SERTRALINE HCL 50 MG PO TABS
100.0000 mg | ORAL_TABLET | Freq: Every day | ORAL | Status: DC
  Administered 2019-03-09 – 2019-03-11 (×3): 100 mg via ORAL
  Filled 2019-03-09 (×3): qty 2

## 2019-03-09 MED ORDER — SODIUM CHLORIDE 0.9 % IV BOLUS
1000.0000 mL | Freq: Once | INTRAVENOUS | Status: AC
  Administered 2019-03-09: 1000 mL via INTRAVENOUS

## 2019-03-09 MED ORDER — LORAZEPAM 1 MG PO TABS
1.0000 mg | ORAL_TABLET | Freq: Four times a day (QID) | ORAL | Status: DC | PRN
  Administered 2019-03-09 – 2019-03-10 (×3): 1 mg via ORAL
  Filled 2019-03-09 (×3): qty 1

## 2019-03-09 MED ORDER — ONDANSETRON HCL 4 MG/2ML IJ SOLN: 4.0000 mg | Freq: Four times a day (QID) | INTRAMUSCULAR | Status: DC | PRN

## 2019-03-09 MED ORDER — ONDANSETRON HCL 4 MG/2ML IJ SOLN: 4.0000 mg | Freq: Four times a day (QID) | INTRAMUSCULAR | Status: DC

## 2019-03-09 MED ORDER — MORPHINE SULFATE (PF) 4 MG/ML IV SOLN
4.0000 mg | Freq: Once | INTRAVENOUS | Status: DC
  Filled 2019-03-09: qty 1

## 2019-03-09 MED ORDER — SIMVASTATIN 20 MG PO TABS
40.0000 mg | ORAL_TABLET | Freq: Every day | ORAL | Status: DC
  Administered 2019-03-09 – 2019-03-10 (×2): 40 mg via ORAL
  Filled 2019-03-09 (×2): qty 2

## 2019-03-09 MED ORDER — SODIUM CHLORIDE 0.9% FLUSH
3.0000 mL | Freq: Once | INTRAVENOUS | Status: DC
  Administered 2019-03-09: 14:00:00 3 mL via INTRAVENOUS

## 2019-03-09 MED ORDER — TRAZODONE HCL 50 MG PO TABS
200.0000 mg | ORAL_TABLET | Freq: Every day | ORAL | Status: DC
  Administered 2019-03-09 – 2019-03-10 (×2): 200 mg via ORAL
  Filled 2019-03-09 (×2): qty 4

## 2019-03-09 MED ORDER — CIPROFLOXACIN IN D5W 400 MG/200ML IV SOLN
400.0000 mg | Freq: Two times a day (BID) | INTRAVENOUS | Status: DC
  Administered 2019-03-09 – 2019-03-11 (×4): 400 mg via INTRAVENOUS
  Filled 2019-03-09 (×5): qty 200

## 2019-03-09 MED ORDER — ONDANSETRON HCL 4 MG/2ML IJ SOLN
4.0000 mg | Freq: Once | INTRAMUSCULAR | Status: AC
  Administered 2019-03-09: 09:00:00 4 mg via INTRAVENOUS
  Filled 2019-03-09: qty 2

## 2019-03-09 MED ORDER — KETOROLAC TROMETHAMINE 30 MG/ML IJ SOLN
INTRAMUSCULAR | Status: AC
  Filled 2019-03-09: qty 1

## 2019-03-09 MED ORDER — PANTOPRAZOLE SODIUM 40 MG PO TBEC
40.0000 mg | DELAYED_RELEASE_TABLET | Freq: Every day | ORAL | Status: DC
  Administered 2019-03-10 – 2019-03-11 (×2): 40 mg via ORAL
  Filled 2019-03-09 (×3): qty 1

## 2019-03-09 MED ORDER — SPIRONOLACTONE 25 MG PO TABS
25.0000 mg | ORAL_TABLET | Freq: Two times a day (BID) | ORAL | Status: DC
  Administered 2019-03-09 – 2019-03-11 (×4): 25 mg via ORAL
  Filled 2019-03-09 (×5): qty 1

## 2019-03-09 MED ORDER — FAMOTIDINE 20 MG PO TABS
20.0000 mg | ORAL_TABLET | Freq: Two times a day (BID) | ORAL | Status: DC
  Administered 2019-03-09 – 2019-03-11 (×4): 20 mg via ORAL
  Filled 2019-03-09 (×4): qty 1

## 2019-03-09 MED ORDER — IRBESARTAN 150 MG PO TABS
300.0000 mg | ORAL_TABLET | Freq: Every day | ORAL | Status: DC
  Administered 2019-03-09 – 2019-03-11 (×3): 300 mg via ORAL
  Filled 2019-03-09 (×3): qty 2

## 2019-03-09 MED ORDER — DOCUSATE SODIUM 100 MG PO CAPS
100.0000 mg | ORAL_CAPSULE | Freq: Every day | ORAL | Status: DC | PRN
  Administered 2019-03-09: 13:00:00 100 mg via ORAL
  Filled 2019-03-09: qty 1

## 2019-03-09 NOTE — ED Triage Notes (Signed)
Pt here with c/o abdominal pain and chronic back pain, states blood in her urine since last night. Was recently discharged from hospital for the same. Pt in NAD.

## 2019-03-09 NOTE — ED Notes (Signed)
Patient given warm blanket.

## 2019-03-09 NOTE — ED Provider Notes (Signed)
[facility] Emergency Department Provider Note  Time seen: 8:27 AM  I have reviewed the triage vital signs and the nursing notes.   HISTORY  Chief Complaint Abdominal Pain and Back Pain   HPI Mary Vega is a 73 y.o. female past medical history anxiety, gastric reflux, hypertension, hyperlipidemia, UTI, presents to the emergency department for lower abdominal discomfort and dysuria.  According to the patient and record review patient was discharged from the hospital 02/16/2019 after an admission for pyelonephritis after failing outpatient therapy.  Patient states since going home over the past 2 to 3 weeks she has continued to have vague abdominal discomfort especially across the lower abdomen abdomen.  However 2 nights ago she began with dysuria and increased lower abdominal pain, last night she began with hematuria once again.  Patient denies any vomiting or diarrhea.  No fever cough congestion or shortness of breath.   Past Medical History:  Diagnosis Date  . Anxiety    takes Lorazepam daily  . Chronic lower back pain    buldging disc  . Constipation    takes Stool Softener daily as needed  . Depression    takes Zoloft daily  . GERD (gastroesophageal reflux disease)    takes Omeprazole daily  . History of blood transfusion    "when I had cancer surgery; hysterectomy"  . History of bronchitis    Sept 2016  . History of colon polyps    benign  . Hyperlipidemia    takes Simvastatin daily  . Hypertension    takes Losartan daily   . Insomnia    takes Trazodone nightly  . Kidney stone    "must have passed it"  . OSA on CPAP   . Ovarian cancer ([place])   . Peripheral edema    takes Spironolactone daily  . Peripheral neuropathy   . Pneumonia ~ 2012  . PONV (postoperative nausea and vomiting)   . Sinus headache   . Urinary frequency   . UTI (lower urinary tract infection)    being treated with Macrobid,completed today 11/11/15    Patient Active  Problem List   Diagnosis Date Noted  . Acute pyelonephritis 02/14/2019  . Thyroid mass 11/17/2015    Past Surgical History:  Procedure Laterality Date  . ABDOMINAL HYSTERECTOMY    . CATARACT EXTRACTION W/ INTRAOCULAR LENS  IMPLANT, BILATERAL Bilateral   . COLONOSCOPY    . DIAGNOSTIC LAPAROSCOPY     to drain cyst on ovary  . THYROID LOBECTOMY Right 11/17/2015  . THYROID SURGERY  X 2   goiter removed  . THYROIDECTOMY Right 11/17/2015   Procedure: RIGHT THYROID LOBECTOMY WITH FROZEN SECTION;  Surgeon: Melissa Montane, MD;  Location: [facility];  Service: ENT;  Laterality: Right;    Prior to Admission medications   Medication Sig Start Date End Date Taking? Authorizing Provider  amLODipine (NORVASC) 2.5 MG tablet Take 2.5 mg by mouth daily.    [provider]  cefdinir (OMNICEF) 300 MG capsule Take 1 capsule (300 mg total) by mouth every 12 (twelve) hours. 02/16/19   [name], MD  docusate sodium (COLACE) 100 MG capsule Take 100 mg by mouth daily as needed for mild constipation.    [provider]  famotidine (PEPCID) 20 MG tablet Take 20 mg by mouth 2 (two) times daily.    [provider]  LORazepam (ATIVAN) 1 MG tablet Take 1 mg by mouth every 6 (six) hours as needed for anxiety.    [provider]  Multiple Vitamin (MULTIVITAMIN WITH MINERALS) TABS tablet Take 1 tablet by mouth daily.    [provider]  sertraline (ZOLOFT) 100 MG tablet Take 100 mg by mouth daily.    [provider]  simvastatin (ZOCOR) 40 MG tablet Take 40 mg by mouth at bedtime.     [provider]  spironolactone (ALDACTONE) 25 MG tablet Take 25 mg by mouth 2 (two) times daily.     [provider]  telmisartan (MICARDIS) 80 MG tablet Take 80 mg by mouth daily.    [provider]  traZODone (DESYREL) 100 MG tablet Take 200 mg by mouth at bedtime.    [provider]    Allergies  Allergen Reactions  . Iodine-131 Anaphylaxis and  Itching  . Ivp Dye [Iodinated Diagnostic Agents] Anaphylaxis  . Sulfa Antibiotics Hives and Other (See Comments)    Septic?  . Sulfamethoxazole Hives and Rash  . Clams [Shellfish Allergy] Diarrhea and Nausea And Vomiting  . Phenobarbital Other (See Comments)    Tremor (intolerance)    No family history on file.  Social History Social History   Tobacco Use  . Smoking status: Former Smoker    Packs/day: 0.50    Years: 47.00    Pack years: 23.50    Types: Cigarettes  . Smokeless tobacco: Never Used  . Tobacco comment: quit smoking in ~ 2012  Substance Use Topics  . Alcohol use: No  . Drug use: No    Review of Systems Constitutional: Negative for fever. Cardiovascular: Negative for chest pain. Respiratory: Negative for shortness of breath. Gastrointestinal: Mild to moderate lower abdominal discomfort.  Negative for vomiting or diarrhea Genitourinary: Intermittent dysuria over the past 2 days, hematuria since last night. Musculoskeletal: Negative for musculoskeletal complaints Skin: Negative for skin complaints  Neurological: Negative for headache All other ROS negative  ____________________________________________   PHYSICAL EXAM:  VITAL SIGNS: ED Triage Vitals  Enc Vitals Group     BP 03/09/19 0707 98/66     Pulse Rate 03/09/19 0707 68     Resp 03/09/19 0707 18     Temp 03/09/19 0707 99 F (37.2 C)     Temp Source 03/09/19 0707 Oral     SpO2 03/09/19 0707 97 %     Weight 03/09/19 0707 175 lb (79.4 kg)     Height 03/09/19 0707 5\' 5"  (1.651 m)     Head Circumference --      Peak Flow --      Pain Score 03/09/19 0718 5     Pain Loc --      Pain Edu? --      Excl. in [place]? --    Constitutional: Alert and oriented. Well appearing and in no distress. Eyes: Normal exam ENT      Head: Normocephalic and atraumatic.      Mouth/Throat: Mucous membranes are moist. Cardiovascular: Normal rate, regular rhythm. No murmur Respiratory: Normal respiratory effort without  tachypnea nor retractions. Breath sounds are clear Gastrointestinal: Soft, mild to moderate suprapubic tenderness palpation, otherwise benign abdominal exam.  No rebound guarding or distention. Musculoskeletal: Nontender with normal range of motion in all extremities.  Neurologic:  Normal speech and language. No gross focal neurologic deficits  Skin:  Skin is warm, dry and intact.  Psychiatric: Mood and affect are normal.   ____________________________________________    INITIAL IMPRESSION / ASSESSMENT AND PLAN / ED COURSE  Pertinent labs & imaging results that were available during my care of  the patient were reviewed by me and considered in my medical decision making (see chart for details).   Patient presents emergency department for lower abdominal discomfort intermittent dysuria and hematuria over the past 2 days.  Patient was discharged from the hospital 6 last 15/20 after an admission for pyelonephritis.  Patient states continued lower abdominal discomfort intermittently that is worsened over the past 2 days.  Patient had a CT 02/14/2019 showing pyelonephritis without any other acute findings.  Patient's lab work today shows a extremely slight leukocytosis otherwise largely within normal limits.  Urinalysis is pending.  We will treat pain, IV hydrate and obtain a urinalysis to further differentiate.  Patient agreeable to plan of care.  Differential at this time would include urinary tract infection, pyelonephritis, intra-abdominal pathology.  urine is consistent with urinary tract infection.  Given the patient's history of outpatient failure of antibiotic treatment requiring IV antibiotics from her recent admission.  We will admit the patient once again for IV antibiotics until the culture grows out.  Patient agreeable to plan of care.  Mary Vega was evaluated in Emergency Department on 03/09/2019 for the symptoms described in the history of present illness. She was evaluated in the  context of the global COVID-19 pandemic, which necessitated consideration that the patient might be at risk for infection with the SARS-CoV-2 virus that causes COVID-19. Institutional protocols and algorithms that pertain to the evaluation of patients at risk for COVID-19 are in a state of rapid change based on information released by regulatory bodies including the CDC and federal and state organizations. These policies and algorithms were followed during the patient's care in the ED.  ____________________________________________   FINAL CLINICAL IMPRESSION(S) / ED DIAGNOSES  Lower abdominal pain Urinary tract infection   Harvest Dark, MD 03/09/19 1120

## 2019-03-09 NOTE — H&P (Addendum)
[facility] [place] NAME: Mary Vega    MR#:  [id number]  DATE OF BIRTH:  Jul 15, 1946  DATE OF ADMISSION:  03/09/2019  PRIMARY CARE PHYSICIAN: Nicola Girt, DO   REQUESTING/REFERRING PHYSICIAN: Harvest Dark, MD  CHIEF COMPLAINT:   Chief Complaint  Patient presents with  . Abdominal Pain  . Back Pain    HISTORY OF PRESENT ILLNESS:  73 y.o. female with pertinent past medical history of hypertension, hyperlipidemia, depression and anxiety, OSA, ovarian cancer, GERD, and recurrent UTIs presenting to the ED with complaints of abdominal pain and bilateral flank pain left greater than right.  Patient was recently admitted for similar symptoms on 02/14/2019.  At that time she was found to have acute pyelonephritis with failed outpatient antibiotics.  He was treated with IV Rocephin during the hospital course and discharged on p.o. Omnicef.  Patient reports that since discharge, she has had intermittent left flank pain and now right associated with nausea, abdominal pain, dysuria, urinary retention and hematuria since last night.  Denies other associated symptoms of vomiting, diarrhea, fever or chills, cough, shortness of breath or chest pain.  On arrival to the ED, she was afebrile with blood pressure 98/66 mm Hg and pulse rate 68 beats/min. There were no focal neurological deficits; she was alert and oriented x4, and he did not demonstrate any memory deficits.  Initial labs revealed lipase 31, normal CMP, mild leukocytosis 10.9 was unremarkable CBC, UA positive for UTI.  Urine culture were obtained, and she was started on intravenous antibiotics and IV hydration.  She will be admitted to hospitalist service for further management.  PAST MEDICAL HISTORY:   Past Medical History:  Diagnosis Date  . Anxiety    takes Lorazepam daily  . Chronic lower back pain    buldging disc  . Constipation    takes Stool Softener daily as needed  .  Depression    takes Zoloft daily  . GERD (gastroesophageal reflux disease)    takes Omeprazole daily  . History of blood transfusion    "when I had cancer surgery; hysterectomy"  . History of bronchitis    Sept 2016  . History of colon polyps    benign  . Hyperlipidemia    takes Simvastatin daily  . Hypertension    takes Losartan daily   . Insomnia    takes Trazodone nightly  . Kidney stone    "must have passed it"  . OSA on CPAP   . Ovarian cancer ([place])   . Peripheral edema    takes Spironolactone daily  . Peripheral neuropathy   . Pneumonia ~ 2012  . PONV (postoperative nausea and vomiting)   . Sinus headache   . Urinary frequency   . UTI (lower urinary tract infection)    being treated with Macrobid,completed today 11/11/15    PAST SURGICAL HISTORY:   Past Surgical History:  Procedure Laterality Date  . ABDOMINAL HYSTERECTOMY    . CATARACT EXTRACTION W/ INTRAOCULAR LENS  IMPLANT, BILATERAL Bilateral   . COLONOSCOPY    . DIAGNOSTIC LAPAROSCOPY     to drain cyst on ovary  . THYROID LOBECTOMY Right 11/17/2015  . THYROID SURGERY  X 2   goiter removed  . THYROIDECTOMY Right 11/17/2015   Procedure: RIGHT THYROID LOBECTOMY WITH FROZEN SECTION;  Surgeon: Melissa Montane, MD;  Location: [facility];  Service: ENT;  Laterality: Right;    SOCIAL HISTORY:   Social History   Tobacco  Use  . Smoking status: Former Smoker    Packs/day: 0.50    Years: 47.00    Pack years: 23.50    Types: Cigarettes  . Smokeless tobacco: Never Used  . Tobacco comment: quit smoking in ~ 2012  Substance Use Topics  . Alcohol use: No    FAMILY HISTORY:  No family history on file.  DRUG ALLERGIES:   Allergies  Allergen Reactions  . Iodine-131 Anaphylaxis and Itching  . Ivp Dye [Iodinated Diagnostic Agents] Anaphylaxis  . Sulfa Antibiotics Hives and Other (See Comments)    Septic?  . Sulfamethoxazole Hives and Rash  . Clams [Shellfish Allergy] Diarrhea and Nausea And Vomiting  .  Phenobarbital Other (See Comments)    Tremor (intolerance)    REVIEW OF SYSTEMS:   Review of Systems  Constitutional: Negative for malaise/fatigue and weight loss.  HENT: Negative for congestion, hearing loss and sore throat.   Eyes: Negative for blurred vision and double vision.  Respiratory: Negative for cough, shortness of breath and wheezing.   Cardiovascular: Negative for chest pain, palpitations, orthopnea and leg swelling.  Gastrointestinal: Positive for abdominal pain, constipation and nausea. Negative for diarrhea and vomiting.  Genitourinary: Positive for dysuria, flank pain, frequency, hematuria and urgency.  Musculoskeletal: Positive for back pain. Negative for myalgias.       Bilateral flank pain left>right  Skin: Negative for rash.  Neurological: Negative for dizziness, sensory change, speech change, focal weakness and headaches.  Psychiatric/Behavioral: Negative for depression.   MEDICATIONS AT HOME:   Prior to Admission medications   Medication Sig Start Date End Date Taking? Authorizing Provider  amLODipine (NORVASC) 2.5 MG tablet Take 2.5 mg by mouth daily.   Yes [provider]  docusate sodium (COLACE) 100 MG capsule Take 100 mg by mouth daily as needed for mild constipation.   Yes [provider]  famotidine (PEPCID) 20 MG tablet Take 20 mg by mouth 2 (two) times daily.   Yes [provider]  LORazepam (ATIVAN) 1 MG tablet Take 1 mg by mouth every 6 (six) hours as needed for anxiety.   Yes [provider]  Multiple Vitamin (MULTIVITAMIN WITH MINERALS) TABS tablet Take 1 tablet by mouth daily.   Yes [provider]  sertraline (ZOLOFT) 100 MG tablet Take 100 mg by mouth daily.   Yes [provider]  simvastatin (ZOCOR) 40 MG tablet Take 40 mg by mouth at bedtime.    Yes [provider]  spironolactone (ALDACTONE) 25 MG tablet Take 25 mg by mouth 2 (two) times daily.    Yes [provider]   telmisartan (MICARDIS) 80 MG tablet Take 80 mg by mouth daily.   Yes [provider]  traZODone (DESYREL) 100 MG tablet Take 200 mg by mouth at bedtime.   Yes [provider]  cefdinir (OMNICEF) 300 MG capsule Take 1 capsule (300 mg total) by mouth every 12 (twelve) hours. Patient not taking: Reported on 03/09/2019 02/16/19   [name], MD      VITAL SIGNS:  Blood pressure (!) 151/69, pulse (!) 57, temperature 99 F (37.2 C), temperature source Oral, resp. rate 16, height 5\' 5"  (1.651 m), weight 79.4 kg, SpO2 97 %.  PHYSICAL EXAMINATION:   Physical Exam  GENERAL:  73 y.o.-year-old patient lying in the bed with no acute distress.  EYES: Pupils equal, round, reactive to light and accommodation. No scleral icterus. Extraocular muscles intact.  HEENT: Head atraumatic, normocephalic. Oropharynx and nasopharynx clear.  NECK:  Supple,  no jugular venous distention. No thyroid enlargement, no tenderness.  LUNGS: Normal breath sounds bilaterally, no wheezing, rales,rhonchi or crepitation. No use of accessory muscles of respiration.  CARDIOVASCULAR: S1, S2 normal. No murmurs, rubs, or gallops.  ABDOMEN: Soft, nontender, nondistended. Bowel sounds present. No organomegaly or mass. Costovertebral angle tenderness bilaterally left>right EXTREMITIES: No pedal edema, cyanosis, or clubbing. No rash or lesions. + pedal pulses MUSCULOSKELETAL: Normal bulk, and power was 5+ grip and elbow, knee, and ankle flexion and extension bilaterally.  NEUROLOGIC:Alert and oriented x 3. CN 2-12 intact. Sensation to light touch and cold stimuli intact bilaterally. Finger to nose nl. Babinski is downgoing. DTR's (biceps, patellar, and achilles) 2+ and symmetric throughout. Gait not tested due to safety concern. PSYCHIATRIC: The patient is alert and oriented x 3.  SKIN: No obvious rash, lesion, or ulcer.   DATA REVIEWED:  LABORATORY PANEL:   CBC Recent Labs  Lab 03/09/19 0709  WBC 10.9*  HGB  13.4  HCT 40.0  PLT 189   ------------------------------------------------------------------------------------------------------------------  Chemistries  Recent Labs  Lab 03/09/19 0709  NA 142  K 4.5  CL 109  CO2 25  GLUCOSE 128*  BUN 16  CREATININE 0.97  CALCIUM 10.5*  AST 19  ALT 30  ALKPHOS 90  BILITOT 0.6   ------------------------------------------------------------------------------------------------------------------  Cardiac Enzymes No results for input(s): TROPONINI in the last 168 hours. ------------------------------------------------------------------------------------------------------------------  RADIOLOGY:  No results found.  EKG:  EKG: there are no previous tracings available for comparison.  IMPRESSION AND PLAN:   73 y.o. female with pertinent past medical history of hypertension, hyperlipidemia, depression and anxiety, OSA, ovarian cancer, GERD, and recurrent UTIs presenting to the ED with complaints of abdominal pain and bilateral flank pain left greater than right, dysuria persistent nausea without vomiting.  1. UTI -  Recently admitted with acute pyelonephritis now presenting with worsening symptoms - Admit to MedSurg unit - UA shows UTI with presence of red blood cells, WBC greater than 50 with rare bacteria - Check Renal ultrasound - Concerns for recurrent pyelonephritis - Recent treatment with IV Rocephin and Omnicef - Start Ciproflaxin - UA cultures pending - IVFs - PRN pain management - Urology consult. Message sent via [facility] to Dr. Diamantina Providence  2. HLD + Goal LDL<100 - Simvastatin 40mg  PO qhs  3. HTN + Goal BP <140/90 -Continue amlodipine -Continue Avapro  4. Depression and Anxiety - Continue home sertraline andTrazodone  5. GERD - Protonix   All the records are reviewed and case discussed with ED provider. Management plans discussed with the patient, family and they are in agreement.  CODE STATUS: FULL  TOTAL TIME TAKING  CARE OF THIS PATIENT: 20minutes.    on 03/09/2019 at 12:14 PM  Rufina Falco, DNP, FNP-BC [facility] Nurse Practitioner Between 7am to 6pm - Pager [phone]  After 6pm go to [email or web address] - password [email or web address] [facility]  Office  [phone]  CC: Primary care physician; Nicola Girt, DO

## 2019-03-09 NOTE — ED Notes (Signed)
Patient assisted to restroom.  Will continue to monitor.

## 2019-03-09 NOTE — ED Notes (Signed)
MD in room to assess patient.  Will continue to monitor.   

## 2019-03-09 NOTE — ED Notes (Signed)
Patient given ED phone to call husband.

## 2019-03-10 LAB — BASIC METABOLIC PANEL
Anion gap: 5 (ref 5–15)
BUN: 15 mg/dL (ref 8–23)
CO2: 23 mmol/L (ref 22–32)
Calcium: 9.5 mg/dL (ref 8.9–10.3)
Chloride: 113 mmol/L — ABNORMAL HIGH (ref 98–111)
Creatinine, Ser: 0.9 mg/dL (ref 0.44–1.00)
GFR calc Af Amer: >60 mL/min (ref >60)
GFR calc non Af Amer: >60 mL/min (ref >60)
Glucose, Bld: 128 mg/dL — ABNORMAL HIGH (ref 70–99)
Potassium: 3.8 mmol/L (ref 3.5–5.1)
Sodium: 141 mmol/L (ref 135–145)

## 2019-03-10 LAB — CBC
HCT: 34 % — ABNORMAL LOW (ref 36.0–46.0)
Hemoglobin: 11.3 g/dL — ABNORMAL LOW (ref 12.0–15.0)
MCH: 30.8 pg (ref 26.0–34.0)
MCHC: 33.2 g/dL (ref 30.0–36.0)
MCV: 92.6 fL (ref 80.0–100.0)
Platelets: 163 10*3/uL (ref 150–400)
RBC: 3.67 MIL/uL — ABNORMAL LOW (ref 3.87–5.11)
RDW: 12.8 % (ref 11.5–15.5)
WBC: 9.4 10*3/uL (ref 4.0–10.5)
nRBC: 0 % (ref 0.0–0.2)

## 2019-03-10 MED ORDER — ENOXAPARIN SODIUM 40 MG/0.4ML ~~LOC~~ SOLN
40.0000 mg | SUBCUTANEOUS | Status: DC
  Administered 2019-03-10: 40 mg via SUBCUTANEOUS
  Filled 2019-03-10: qty 0.4

## 2019-03-10 NOTE — Progress Notes (Signed)
Cont on iv cipro tol well. Up and about in room. No distress.

## 2019-03-10 NOTE — Progress Notes (Signed)
Patient ID: Mary Vega, female   DOB: [birth year], 73 y.o.   MRN: [id number]  [facility] PROGRESS NOTE  Mary Vega TGG:[id number] DOB: [birth year] DOA: 03/09/2019 PCP: Nicola Girt, DO  HPI/Subjective: Patient states that she never felt better from the last time she was in the hospital.  She has been tired all the time.  Having intermittent back pain.  Could not eat very well.  Some burning on urination.  Feeling like that she could not empty her bladder completely.  The last time she was in the hospital urine culture was negative.  Objective: Vitals:   03/10/19 0458 03/10/19 0935  BP: (!) 142/72 138/62  Pulse: (!) 57 (!) 55  Resp:  20  Temp: 98.6 F (37 C) 98.7 F (37.1 C)  SpO2: 97% 97%    Intake/Output Summary (Last 24 hours) at 03/10/2019 1440 Last data filed at 03/10/2019 0900 Gross per 24 hour  Intake 1260 ml  Output -  Net 1260 ml   Filed Weights   03/09/19 0707 03/09/19 1328  Weight: 79.4 kg 79.4 kg    ROS: Review of Systems  Constitutional: Positive for malaise/fatigue. Negative for chills and fever.  Eyes: Negative for blurred vision.  Respiratory: Negative for cough and shortness of breath.   Cardiovascular: Negative for chest pain.  Gastrointestinal: Negative for abdominal pain, constipation, diarrhea, nausea and vomiting.  Genitourinary: Positive for dysuria and flank pain.  Musculoskeletal: Negative for joint pain.  Neurological: Negative for dizziness and headaches.   Exam: Physical Exam  Constitutional: She is oriented to person, place, and time.  HENT:  Nose: No mucosal edema.  Mouth/Throat: No oropharyngeal exudate or posterior oropharyngeal edema.  Eyes: Pupils are equal, round, and reactive to light. Conjunctivae, EOM and lids are normal.  Neck: No JVD present. Carotid bruit is not present. No edema present. No thyroid mass and no thyromegaly present.  Cardiovascular: S1 normal and S2 normal. Exam reveals no gallop.  No murmur  heard. Pulses:      Dorsalis pedis pulses are 2+ on the right side and 2+ on the left side.  Respiratory: No respiratory distress. She has no decreased breath sounds. She has no wheezes. She has no rhonchi. She has no rales.  GI: Soft. Bowel sounds are normal. There is no abdominal tenderness.  Musculoskeletal:     Right ankle: She exhibits no swelling.     Left ankle: She exhibits no swelling.  Lymphadenopathy:    She has no cervical adenopathy.  Neurological: She is alert and oriented to person, place, and time. No cranial nerve deficit.  Skin: Skin is warm. No rash noted. Nails show no clubbing.  Psychiatric: She has a normal mood and affect.      Data Reviewed: Basic Metabolic Panel: Recent Labs  Lab 03/09/19 0709 03/10/19 0505  NA 142 141  K 4.5 3.8  CL 109 113*  CO2 25 23  GLUCOSE 128* 128*  BUN 16 15  CREATININE 0.97 0.90  CALCIUM 10.5* 9.5   Liver Function Tests: Recent Labs  Lab 03/09/19 0709  AST 19  ALT 30  ALKPHOS 90  BILITOT 0.6  PROT 7.7  ALBUMIN 4.3   Recent Labs  Lab 03/09/19 0709  LIPASE 31   CBC: Recent Labs  Lab 03/09/19 0709 03/10/19 0505  WBC 10.9* 9.4  HGB 13.4 11.3*  HCT 40.0 34.0*  MCV 90.1 92.6  PLT 189 163     Recent Results (from the past 240 hour(s))  Urine  culture     Status: Abnormal (Preliminary result)   Collection Time: 03/09/19  9:16 AM   Specimen: Urine, Random  Result Value Ref Range Status   Specimen Description   Final    URINE, RANDOM Performed at [facility], [address] [place], [place] [address]    Special Requests   Final    NONE Performed at [facility], [address] [place], [place] [address]    Culture >=100,000 COLONIES/mL ESCHERICHIA COLI (A)  Final   Report Status PENDING  Incomplete  SARS Coronavirus 2 (CEPHEID - Performed in [place] hospital lab), Hosp Order     Status: None   Collection Time: 03/09/19 10:30 AM   Specimen: Nasopharyngeal Swab  Result Value  Ref Range Status   SARS Coronavirus 2 NEGATIVE NEGATIVE Final    Comment: (NOTE) If result is NEGATIVE SARS-CoV-2 target nucleic acids are NOT DETECTED. The SARS-CoV-2 RNA is generally detectable in upper and lower  respiratory specimens during the acute phase of infection. The lowest  concentration of SARS-CoV-2 viral copies this assay can detect is 250  copies / mL. A negative result does not preclude SARS-CoV-2 infection  and should not be used as the sole basis for treatment or other  patient management decisions.  A negative result may occur with  improper specimen collection / handling, submission of specimen other  than nasopharyngeal swab, presence of viral mutation(s) within the  areas targeted by this assay, and inadequate number of viral copies  (<250 copies / mL). A negative result must be combined with clinical  observations, patient history, and epidemiological information. If result is POSITIVE SARS-CoV-2 target nucleic acids are DETECTED. The SARS-CoV-2 RNA is generally detectable in upper and lower  respiratory specimens dur ing the acute phase of infection.  Positive  results are indicative of active infection with SARS-CoV-2.  Clinical  correlation with patient history and other diagnostic information is  necessary to determine patient infection status.  Positive results do  not rule out bacterial infection or co-infection with other viruses. If result is PRESUMPTIVE POSTIVE SARS-CoV-2 nucleic acids MAY BE PRESENT.   A presumptive positive result was obtained on the submitted specimen  and confirmed on repeat testing.  While 2019 novel coronavirus  (SARS-CoV-2) nucleic acids may be present in the submitted sample  additional confirmatory testing may be necessary for epidemiological  and / or clinical management purposes  to differentiate between  SARS-CoV-2 and other Sarbecovirus currently known to infect humans.  If clinically indicated additional testing with an  alternate test  methodology [phone]) is advised. The SARS-CoV-2 RNA is generally  detectable in upper and lower respiratory sp ecimens during the acute  phase of infection. The expected result is Negative. Fact Sheet for Patients:  StrictlyIdeas.no Fact Sheet for Healthcare Providers: BankingDealers.co.za This test is not yet approved or cleared by the [place] FDA and has been authorized for detection and/or diagnosis of SARS-CoV-2 by FDA under an Emergency Use Authorization (EUA).  This EUA will remain in effect (meaning this test can be used) for the duration of the COVID-19 declaration under Section 564(b)(1) of the Act, 21 U.S.C. section 360bbb-3(b)(1), unless the authorization is terminated or revoked sooner. Performed at [facility], [address] [place], [place] [address]      Studies: US Renal  Result Date: 03/09/2019 CLINICAL DATA:  73 year old female with a history of flank pain EXAM: RENAL / URINARY TRACT ULTRASOUND COMPLETE COMPARISON:  CT 02/14/2019 FINDINGS: Right  Kidney: Length: 11.1 cm x 4.6 cm x 5.1 cm, 134 cc. Echogenicity similar to that of the adjacent liver. No hydronephrosis. Cystic lesion measures 1.2 cm x 1.2 cm x 0.9 cm at the lower pole, similar to that on the prior CT. Left Kidney: Length: 10.1 cm x 4.5 cm x 5.0 cm, 126 cc. Echogenicity similar to that of the contralateral kidney. No hydronephrosis. Bladder: Decompressed urinary bladder IMPRESSION: Sonographic survey demonstrates no evidence of hydronephrosis. Right-sided Bosniak 1 cyst Electronically Signed   By: Corrie Mckusick D.O.   On: 03/09/2019 15:24    Scheduled Meds: . amLODipine  2.5 mg Oral Daily  . enoxaparin (LOVENOX) injection  40 mg Subcutaneous Q24H  . famotidine  20 mg Oral BID  . irbesartan  300 mg Oral Daily  .  morphine injection  4 mg Intravenous Once  . pantoprazole  40 mg Oral Daily  . sertraline  100 mg Oral Daily  .  simvastatin  40 mg Oral QHS  . spironolactone  25 mg Oral BID  . traZODone  200 mg Oral QHS   Continuous Infusions: . sodium chloride 100 mL/hr at 03/10/19 0418  . ciprofloxacin 400 mg (03/10/19 0418)    Assessment/Plan:  1. Acute cystitis.  Patient was recently admitted with an acute pyelonephritis but her urine culture at that time was negative.  Never felt like she got better.  Still having fatigue, back pain poor appetite burning on urination and feeling like she cannot empty her bladder.  Current urine culture growing E. coli but sensitivities are pending.  I will to make sure that she is on the correct antibiotic prior to disposition.  I will wait for sensitivities to come back.  Patient currently on Cipro. 2. Hypertension on low-dose amlodipine and irbesartan 3. Hyperlipidemia unspecified on simvastatin 4. Depression anxiety on Zoloft and trazodone  Code Status:     Code Status Orders  (From admission, onward)         Start     Ordered   03/09/19 1204  Full code  Continuous     03/09/19 1203        Code Status History    Date Active Date Inactive Code Status Order ID Comments User Context   02/14/2019 1221 02/16/2019 1502 Full Code [id number]  [name], MD Inpatient   11/17/2015 1433 11/19/2015 1644 Full Code [id number]  Melissa Montane, MD Inpatient   Advance Care Planning Activity     Family Communication: Spoke with husband on the phone Disposition Plan: Likely discharge home tomorrow once sensitivities back  Antibiotics:  Cipro  Time spent: 28 minutes  [place]

## 2019-03-11 LAB — URINE CULTURE: Culture: >=100000 — AB

## 2019-03-11 LAB — BASIC METABOLIC PANEL
Anion gap: 6 (ref 5–15)
BUN: 15 mg/dL (ref 8–23)
CO2: 23 mmol/L (ref 22–32)
Calcium: 9.9 mg/dL (ref 8.9–10.3)
Chloride: 111 mmol/L (ref 98–111)
Creatinine, Ser: 1.02 mg/dL — ABNORMAL HIGH (ref 0.44–1.00)
GFR calc Af Amer: >60 mL/min (ref >60)
GFR calc non Af Amer: 55 mL/min — ABNORMAL LOW (ref >60)
Glucose, Bld: 110 mg/dL — ABNORMAL HIGH (ref 70–99)
Potassium: 4 mmol/L (ref 3.5–5.1)
Sodium: 140 mmol/L (ref 135–145)

## 2019-03-11 LAB — CBC
HCT: 35.6 % — ABNORMAL LOW (ref 36.0–46.0)
Hemoglobin: 11.7 g/dL — ABNORMAL LOW (ref 12.0–15.0)
MCH: 29.8 pg (ref 26.0–34.0)
MCHC: 32.9 g/dL (ref 30.0–36.0)
MCV: 90.6 fL (ref 80.0–100.0)
Platelets: 169 10*3/uL (ref 150–400)
RBC: 3.93 MIL/uL (ref 3.87–5.11)
RDW: 12.7 % (ref 11.5–15.5)
WBC: 7.3 10*3/uL (ref 4.0–10.5)
nRBC: 0 % (ref 0.0–0.2)

## 2019-03-11 MED ORDER — SODIUM CHLORIDE 0.9 % IV SOLN
2.0000 g | Freq: Once | INTRAVENOUS | Status: AC
  Administered 2019-03-11: 11:00:00 2 g via INTRAVENOUS
  Filled 2019-03-11: qty 2

## 2019-03-11 MED ORDER — CEPHALEXIN 500 MG PO CAPS: 500.0000 mg | ORAL_CAPSULE | Freq: Three times a day (TID) | ORAL | 0 refills | Status: AC

## 2019-03-11 NOTE — Plan of Care (Signed)
  Problem: Education: Goal: Knowledge of General Education information will improve Description Including pain rating scale, medication(s)/side effects and non-pharmacologic comfort measures Outcome: Progressing   

## 2019-03-11 NOTE — Progress Notes (Signed)
Sl d/cd. Pt  Ready for discharge. Called husband to pick her up

## 2019-03-11 NOTE — Progress Notes (Signed)
Pt for discharge home after completion of iv rocephin. Will d/c iv site. A/o. Instructions discussed with pt. meds diet activity and f/u discussed. Verbalizes understanding of all.

## 2019-03-11 NOTE — Discharge Summary (Signed)
[facility] [place] NAME: Mary Vega    MR#:  [id number]  DATE OF BIRTH:  20-Sep-1945  DATE OF ADMISSION:  03/09/2019   ADMITTING PHYSICIAN: Lang Snow, NP  DATE OF DISCHARGE: 03/11/2019  PRIMARY CARE PHYSICIAN: Nicola Girt, DO   ADMISSION DIAGNOSIS:  Lower urinary tract infectious disease [N39.0] Weakness [R53.1] DISCHARGE DIAGNOSIS:  Active Problems:   Complicated UTI (urinary tract infection)  SECONDARY DIAGNOSIS:   Past Medical History:  Diagnosis Date  . Anxiety    takes Lorazepam daily  . Chronic lower back pain    buldging disc  . Constipation    takes Stool Softener daily as needed  . Depression    takes Zoloft daily  . GERD (gastroesophageal reflux disease)    takes Omeprazole daily  . History of blood transfusion    "when I had cancer surgery; hysterectomy"  . History of bronchitis    Sept 2016  . History of colon polyps    benign  . Hyperlipidemia    takes Simvastatin daily  . Hypertension    takes Losartan daily   . Insomnia    takes Trazodone nightly  . Kidney stone    "must have passed it"  . OSA on CPAP   . Ovarian cancer ([place])   . Peripheral edema    takes Spironolactone daily  . Peripheral neuropathy   . Pneumonia ~ 2012  . PONV (postoperative nausea and vomiting)   . Sinus headache   . Urinary frequency   . UTI (lower urinary tract infection)    being treated with Macrobid,completed today 11/11/15   HOSPITAL COURSE:  Chief complaint; abdominal pains  History of presenting complaint; 73 y.o. female with pertinent past medical history of hypertension, hyperlipidemia, depression and anxiety, OSA, ovarian cancer, GERD, and recurrent UTIs presenting to the ED with complaints of abdominal pain and bilateral flank pain left greater than right. Patient was recently admitted for similar symptoms on 02/14/2019.  At that time she was found to have acute pyelonephritis with failed  outpatient antibiotics.  He was treated with IV Rocephin during the hospital course and discharged on p.o. Omnicef.  Patient reports that since discharge, she has had intermittent left flank pain and now right associated with nausea, abdominal pain, dysuria, urinary retention and hematuria . UA positive for UTI.  Urine culture were obtained, and she was started on intravenous antibiotics and IV hydration.  She was be admitted to hospitalist service for further management.   Hospital course; Acute cystitis.   Patient was diagnosed with urinary tract infection.  Recently treated for pyelonephritis during last admission.  Urine culture during last admission was negative.  Patient significantly improved clinically with IV antibiotics during this admission.  Remains afebrile.  No leukocytosis.  No abdominal pains.  Urine culture came back with E. coli sensitive to Rocephin.  I discussed antibiotics treatment with pharmacist.  Patient being given 2 g of IV Rocephin now.  Clinically and hemodynamically stable and wishes to be discharged home.  Being discharged on p.o. Keflex 500 mg p.o. 3 times daily for the next 7 days.  Follow-up with primary care physician. 2.  Hypertension.  Blood pressure fairly controlled.  Continue home regimen. 3Hyperlipidemia unspecified on simvastatin 4,Depression anxiety on Zoloft and trazodone   DISCHARGE CONDITIONS:  Stable CONSULTS OBTAINED:  Treatment Team:  Hollice Espy, MD DRUG ALLERGIES:   Allergies  Allergen Reactions  . Iodine-131 Anaphylaxis and Itching  .  Ivp Dye [Iodinated Diagnostic Agents] Anaphylaxis  . Sulfa Antibiotics Hives and Other (See Comments)    Septic?  . Sulfamethoxazole Hives and Rash  . Clams [Shellfish Allergy] Diarrhea and Nausea And Vomiting  . Phenobarbital Other (See Comments)    Tremor (intolerance)   DISCHARGE MEDICATIONS:   Allergies as of 03/11/2019      Reactions   Iodine-131 Anaphylaxis, Itching   Ivp Dye [iodinated  Diagnostic Agents] Anaphylaxis   Sulfa Antibiotics Hives, Other (See Comments)   Septic?   Sulfamethoxazole Hives, Rash   Clams [shellfish Allergy] Diarrhea, Nausea And Vomiting   Phenobarbital Other (See Comments)   Tremor (intolerance)      Medication List    STOP taking these medications   cefdinir 300 MG capsule Commonly known as: OMNICEF     TAKE these medications   amLODipine 2.5 MG tablet Commonly known as: NORVASC Take 2.5 mg by mouth daily.   cephALEXin 500 MG capsule Commonly known as: KEFLEX Take 1 capsule (500 mg total) by mouth 3 (three) times daily for 7 days.   docusate sodium 100 MG capsule Commonly known as: COLACE Take 100 mg by mouth daily as needed for mild constipation.   famotidine 20 MG tablet Commonly known as: PEPCID Take 20 mg by mouth 2 (two) times daily.   LORazepam 1 MG tablet Commonly known as: ATIVAN Take 1 mg by mouth every 6 (six) hours as needed for anxiety.   multivitamin with minerals Tabs tablet Take 1 tablet by mouth daily.   sertraline 100 MG tablet Commonly known as: ZOLOFT Take 100 mg by mouth daily.   simvastatin 40 MG tablet Commonly known as: ZOCOR Take 40 mg by mouth at bedtime.   spironolactone 25 MG tablet Commonly known as: ALDACTONE Take 25 mg by mouth 2 (two) times daily.   telmisartan 80 MG tablet Commonly known as: MICARDIS Take 80 mg by mouth daily.   traZODone 100 MG tablet Commonly known as: DESYREL Take 200 mg by mouth at bedtime.        DISCHARGE INSTRUCTIONS:   DIET:  Cardiac diet DISCHARGE CONDITION:  Stable ACTIVITY:  Activity as tolerated OXYGEN:  Home Oxygen: No.  Oxygen Delivery: room air DISCHARGE LOCATION:  home   If you experience worsening of your admission symptoms, develop shortness of breath, life threatening emergency, suicidal or homicidal thoughts you must seek medical attention immediately by calling 911 or calling your MD immediately  if symptoms less severe.  You  Must read complete instructions/literature along with all the possible adverse reactions/side effects for all the Medicines you take and that have been prescribed to you. Take any new Medicines after you have completely understood and accpet all the possible adverse reactions/side effects.   Please note  You were cared for by a hospitalist during your hospital stay. If you have any questions about your discharge medications or the care you received while you were in the hospital after you are discharged, you can call the unit and asked to speak with the hospitalist on call if the hospitalist that took care of you is not available. Once you are discharged, your primary care physician will handle any further medical issues. Please note that NO REFILLS for any discharge medications will be authorized once you are discharged, as it is imperative that you return to your primary care physician (or establish a relationship with a primary care physician if you do not have one) for your aftercare needs so that they can reassess your  need for medications and monitor your lab values.    On the day of Discharge:  VITAL SIGNS:  Blood pressure 140/71, pulse (!) 56, temperature 98.5 F (36.9 C), temperature source Oral, resp. rate 20, height 5\' 5"  (1.651 m), weight 79.4 kg, SpO2 94 %. PHYSICAL EXAMINATION:  GENERAL:  73 y.o.-year-old patient lying in the bed with no acute distress.  EYES: Pupils equal, round, reactive to light and accommodation. No scleral icterus. Extraocular muscles intact.  HEENT: Head atraumatic, normocephalic. Oropharynx and nasopharynx clear.  NECK:  Supple, no jugular venous distention. No thyroid enlargement, no tenderness.  LUNGS: Normal breath sounds bilaterally, no wheezing, rales,rhonchi or crepitation. No use of accessory muscles of respiration.  CARDIOVASCULAR: S1, S2 normal. No murmurs, rubs, or gallops.  ABDOMEN: Soft, non-tender, non-distended. Bowel sounds present. No  organomegaly or mass.  EXTREMITIES: No pedal edema, cyanosis, or clubbing.  NEUROLOGIC: Cranial nerves II through XII are intact. Muscle strength 5/5 in all extremities. Sensation intact. Gait not checked.  PSYCHIATRIC: The patient is alert and oriented x 3.  SKIN: No obvious rash, lesion, or ulcer.   DATA REVIEW:   CBC Recent Labs  Lab 03/11/19 0811  WBC 7.3  HGB 11.7*  HCT 35.6*  PLT 169    Chemistries  Recent Labs  Lab 03/09/19 0709  03/11/19 0811  NA 142   < > 140  K 4.5   < > 4.0  CL 109   < > 111  CO2 25   < > 23  GLUCOSE 128*   < > 110*  BUN 16   < > 15  CREATININE 0.97   < > 1.02*  CALCIUM 10.5*   < > 9.9  AST 19  --   --   ALT 30  --   --   ALKPHOS 90  --   --   BILITOT 0.6  --   --    < > = values in this interval not displayed.     Microbiology Results  Results for orders placed or performed during the hospital encounter of 03/09/19  Urine culture     Status: Abnormal   Collection Time: 03/09/19  9:16 AM   Specimen: Urine, Random  Result Value Ref Range Status   Specimen Description   Final    URINE, RANDOM Performed at [facility], [address], [place], [place] [address]    Special Requests   Final    NONE Performed at [facility], [address] [place], [place] [address]    Culture >=100,000 COLONIES/mL ESCHERICHIA COLI (A)  Final   Report Status 03/11/2019 FINAL  Final   Organism ID, Bacteria ESCHERICHIA COLI (A)  Final      Susceptibility   Escherichia coli - MIC*    AMPICILLIN >=32 RESISTANT Resistant     CEFAZOLIN <=4 SENSITIVE Sensitive     CEFTRIAXONE <=1 SENSITIVE Sensitive     CIPROFLOXACIN >=4 RESISTANT Resistant     GENTAMICIN >=16 RESISTANT Resistant     IMIPENEM <=0.25 SENSITIVE Sensitive     NITROFURANTOIN <=16 SENSITIVE Sensitive     TRIMETH/SULFA >=320 RESISTANT Resistant     AMPICILLIN/SULBACTAM >=32 RESISTANT Resistant     PIP/TAZO <=4 SENSITIVE Sensitive     Extended ESBL NEGATIVE Sensitive      * >=100,000 COLONIES/mL ESCHERICHIA COLI  SARS Coronavirus 2 (CEPHEID - Performed in [place] hospital lab), Hosp Order     Status: None   Collection Time: 03/09/19 10:30 AM  Specimen: Nasopharyngeal Swab  Result Value Ref Range Status   SARS Coronavirus 2 NEGATIVE NEGATIVE Final    Comment: (NOTE) If result is NEGATIVE SARS-CoV-2 target nucleic acids are NOT DETECTED. The SARS-CoV-2 RNA is generally detectable in upper and lower  respiratory specimens during the acute phase of infection. The lowest  concentration of SARS-CoV-2 viral copies this assay can detect is 250  copies / mL. A negative result does not preclude SARS-CoV-2 infection  and should not be used as the sole basis for treatment or other  patient management decisions.  A negative result may occur with  improper specimen collection / handling, submission of specimen other  than nasopharyngeal swab, presence of viral mutation(s) within the  areas targeted by this assay, and inadequate number of viral copies  (<250 copies / mL). A negative result must be combined with clinical  observations, patient history, and epidemiological information. If result is POSITIVE SARS-CoV-2 target nucleic acids are DETECTED. The SARS-CoV-2 RNA is generally detectable in upper and lower  respiratory specimens dur ing the acute phase of infection.  Positive  results are indicative of active infection with SARS-CoV-2.  Clinical  correlation with patient history and other diagnostic information is  necessary to determine patient infection status.  Positive results do  not rule out bacterial infection or co-infection with other viruses. If result is PRESUMPTIVE POSTIVE SARS-CoV-2 nucleic acids MAY BE PRESENT.   A presumptive positive result was obtained on the submitted specimen  and confirmed on repeat testing.  While 2019 novel coronavirus  (SARS-CoV-2) nucleic acids may be present in the submitted sample  additional confirmatory  testing may be necessary for epidemiological  and / or clinical management purposes  to differentiate between  SARS-CoV-2 and other Sarbecovirus currently known to infect humans.  If clinically indicated additional testing with an alternate test  methodology [phone]) is advised. The SARS-CoV-2 RNA is generally  detectable in upper and lower respiratory sp ecimens during the acute  phase of infection. The expected result is Negative. Fact Sheet for Patients:  StrictlyIdeas.no Fact Sheet for Healthcare Providers: BankingDealers.co.za This test is not yet approved or cleared by the [place] FDA and has been authorized for detection and/or diagnosis of SARS-CoV-2 by FDA under an Emergency Use Authorization (EUA).  This EUA will remain in effect (meaning this test can be used) for the duration of the COVID-19 declaration under Section 564(b)(1) of the Act, 21 U.S.C. section 360bbb-3(b)(1), unless the authorization is terminated or revoked sooner. Performed at [facility], [address] [place], [place] [address]     RADIOLOGY:  No results found.   Management plans discussed with the patient, family and they are in agreement.  CODE STATUS: Full Code   TOTAL TIME TAKING CARE OF THIS PATIENT: 37 minutes.    Cheril Slattery M.D on 03/11/2019 at 10:25 AM  Between 7am to 6pm - Pager - [phone]  After 6pm go to [email or web address] - Proofreader  [facility]  Office  [phone]  CC: Primary care physician; Nicola Girt, DO   Note: This dictation was prepared with Dragon dictation along with smaller phrase technology. Any transcriptional errors that result from this process are unintentional.

## 2019-04-29 ENCOUNTER — Ambulatory Visit (INDEPENDENT_AMBULATORY_CARE_PROVIDER_SITE_OTHER): Payer: Medicare Other | Admitting: Urology

## 2019-04-29 ENCOUNTER — Ambulatory Visit: Payer: Self-pay | Admitting: Urology

## 2019-04-29 ENCOUNTER — Encounter: Payer: Self-pay | Admitting: Urology

## 2019-04-29 ENCOUNTER — Other Ambulatory Visit: Payer: Self-pay

## 2019-04-29 VITALS — BP 135/75 | HR 74 | Ht 65.0 in | Wt 175.0 lb

## 2019-04-29 DIAGNOSIS — N281 Cyst of kidney, acquired: Secondary | ICD-10-CM

## 2019-04-29 DIAGNOSIS — R310 Gross hematuria: Secondary | ICD-10-CM

## 2019-04-29 DIAGNOSIS — N390 Urinary tract infection, site not specified: Secondary | ICD-10-CM

## 2019-04-29 LAB — MICROSCOPIC EXAMINATION: Bacteria, UA: NONE SEEN

## 2019-04-29 LAB — URINALYSIS, COMPLETE
Bilirubin, UA: NEGATIVE
Glucose, UA: NEGATIVE
Ketones, UA: NEGATIVE
Leukocytes,UA: NEGATIVE
Nitrite, UA: NEGATIVE
Protein,UA: NEGATIVE
Specific Gravity, UA: 1.01 (ref 1.005–1.030)
Urobilinogen, Ur: 0.2 mg/dL (ref 0.2–1.0)
pH, UA: 5.5 (ref 5.0–7.5)

## 2019-04-29 LAB — BLADDER SCAN AMB NON-IMAGING

## 2019-04-29 NOTE — Progress Notes (Signed)
04/29/2019 11:43 AM   Mary Vega May 14, 1946 [id number]  Referring provider: Nicola Girt, [id number] [place],  [place] [address]  Chief Complaint  Patient presents with  . Recurrent UTI    Pt states she often has to apply pressure to abdomen to void completely    CC: series of serious infections    HPI:  [name] was sent over to evaluate voiding symptoms and recurrent urinary tract infection.  She presented to her primary care Dr. on 02/12/2019 complaining of fever at home up to 102 and dysuria. However, she had symptoms for about a week.  Her UA showed greater than 50 white cells, 10-50 red cells and grew a pansensitive E. coli.  2 days later on 02/14/2019 she had more abdominal and flank pain and was admitted with pyelonephritis.  Her white count was 10.9, afebrile.  UA again with a few red cells and white cells.  At this point her blood and urine cultures were negative but CT scan revealed stranding around left kidney c/w pyelo. I compared that back to CT 11/2017.  She presented again on 03/09/2019 with abdominal pain and left greater than right flank pain.  She continued to be afebrile with urine culture growing E. coli sensitive to cephalexin and nitrofurantoin.  A renal ultrasound was done which was benign and showed a nondistended bladder. She did recall an episode of red urine. Cr was 0.9.   Today, she has no dysuria or flank pain. She notices a slower stream and interruption with her flow. She bends over to void or push. Her PVR is 79 mL. These symptoms are new since June 2020.  UA today clear.   She recalls UTI and dysuria in Apr 2020. No cx done (telehealth). Abx did help.  She has no NG risk. She drinks a lot of water but [employer] daily. She has constipation for a couple of years. She had complete hx for Ov Ca in 2004. She had chemo.    PMH: Past Medical History:  Diagnosis Date  . Anxiety    takes Lorazepam daily  . Chronic lower back pain    buldging disc  . Constipation    takes Stool Softener daily as needed  . Depression    takes Zoloft daily  . GERD (gastroesophageal reflux disease)    takes Omeprazole daily  . History of blood transfusion    "when I had cancer surgery; hysterectomy"  . History of bronchitis    Sept 2016  . History of colon polyps    benign  . Hyperlipidemia    takes Simvastatin daily  . Hypertension    takes Losartan daily   . Insomnia    takes Trazodone nightly  . Kidney stone    "must have passed it"  . OSA on CPAP   . Ovarian cancer ([place])   . Peripheral edema    takes Spironolactone daily  . Peripheral neuropathy   . Pneumonia ~ 2012  . PONV (postoperative nausea and vomiting)   . Sinus headache   . Urinary frequency   . UTI (lower urinary tract infection)    being treated with Macrobid,completed today 11/11/15    Surgical History: Past Surgical History:  Procedure Laterality Date  . ABDOMINAL HYSTERECTOMY    . CATARACT EXTRACTION W/ INTRAOCULAR LENS  IMPLANT, BILATERAL Bilateral   . COLONOSCOPY    . DIAGNOSTIC LAPAROSCOPY     to drain cyst on ovary  . THYROID LOBECTOMY Right  11/17/2015  . THYROID SURGERY  X 2   goiter removed  . THYROIDECTOMY Right 11/17/2015   Procedure: RIGHT THYROID LOBECTOMY WITH FROZEN SECTION;  Surgeon: Melissa Montane, MD;  Location: [facility];  Service: ENT;  Laterality: Right;    Home Medications:  Allergies as of 04/29/2019      Reactions   Iodine-131 Anaphylaxis, Itching   Ivp Dye [iodinated Diagnostic Agents] Anaphylaxis   Sulfa Antibiotics Hives, Other (See Comments)   Septic?   Sulfamethoxazole Hives, Rash   Clams [shellfish Allergy] Diarrhea, Nausea And Vomiting   Phenobarbital Other (See Comments)   Tremor (intolerance)      Medication List       Accurate as of April 29, 2019 11:43 AM. If you have any questions, ask your nurse or doctor.        amLODipine 2.5 MG tablet Commonly known as: NORVASC Take 2.5 mg by mouth daily.   docusate  sodium 100 MG capsule Commonly known as: COLACE Take 100 mg by mouth daily as needed for mild constipation.   famotidine 20 MG tablet Commonly known as: PEPCID Take 20 mg by mouth 2 (two) times daily.   LORazepam 1 MG tablet Commonly known as: ATIVAN Take 1 mg by mouth every 6 (six) hours as needed for anxiety.   multivitamin with minerals Tabs tablet Take 1 tablet by mouth daily.   rosuvastatin 10 MG tablet Commonly known as: CRESTOR Take 10 mg by mouth daily.   sertraline 100 MG tablet Commonly known as: ZOLOFT Take 100 mg by mouth daily.   simvastatin 40 MG tablet Commonly known as: ZOCOR Take 40 mg by mouth at bedtime.   spironolactone 25 MG tablet Commonly known as: ALDACTONE Take 25 mg by mouth 2 (two) times daily.   telmisartan 80 MG tablet Commonly known as: MICARDIS Take 80 mg by mouth daily.   traZODone 100 MG tablet Commonly known as: DESYREL Take 200 mg by mouth at bedtime.       Allergies:  Allergies  Allergen Reactions  . Iodine-131 Anaphylaxis and Itching  . Ivp Dye [Iodinated Diagnostic Agents] Anaphylaxis  . Sulfa Antibiotics Hives and Other (See Comments)    Septic?  . Sulfamethoxazole Hives and Rash  . Clams [Shellfish Allergy] Diarrhea and Nausea And Vomiting  . Phenobarbital Other (See Comments)    Tremor (intolerance)    Family History: No family history on file.  Social History:  reports that she has quit smoking. Her smoking use included cigarettes. She has a 23.50 pack-year smoking history. She has never used smokeless tobacco. She reports that she does not drink alcohol or use drugs.  ROS:                                        Physical Exam: BP 135/75 (BP Location: Left Arm, Patient Position: Sitting, Cuff Size: Normal)   Pulse 74   Ht 5\' 5"  (1.651 m)   Wt 79.4 kg   BMI 29.12 kg/m   Constitutional:  Alert and oriented, No acute distress. HEENT: [place] AT, moist mucus membranes.  Trachea midline, no  masses. Cardiovascular: No clubbing, cyanosis, or edema. Respiratory: Normal respiratory effort, no increased work of breathing. GI: Abdomen is soft, nontender, nondistended, no abdominal masses GU: No CVA tenderness Skin: No rashes, bruises or suspicious lesions. Neurologic: Grossly intact, no focal deficits, moving all 4 extremities. Psychiatric: Normal mood and affect.  Laboratory Data: Lab Results  Component Value Date   WBC 7.3 03/11/2019   HGB 11.7 (L) 03/11/2019   HCT 35.6 (L) 03/11/2019   MCV 90.6 03/11/2019   PLT 169 03/11/2019    Lab Results  Component Value Date   CREATININE 1.02 (H) 03/11/2019    No results found for: PSA  No results found for: TESTOSTERONE  No results found for: HGBA1C  Urinalysis    Component Value Date/Time   COLORURINE YELLOW (A) 03/09/2019 0916   APPEARANCEUR CLOUDY (A) 03/09/2019 0916   LABSPEC 1.008 03/09/2019 0916   PHURINE 6.0 03/09/2019 0916   GLUCOSEU NEGATIVE 03/09/2019 0916   HGBUR LARGE (A) 03/09/2019 0916   BILIRUBINUR NEGATIVE 03/09/2019 0916   KETONESUR NEGATIVE 03/09/2019 0916   PROTEINUR 30 (A) 03/09/2019 0916   NITRITE NEGATIVE 03/09/2019 0916   LEUKOCYTESUR MODERATE (A) 03/09/2019 0916    Lab Results  Component Value Date   BACTERIA RARE (A) 03/09/2019    Pertinent Imaging: CT x 2 and [place]  No results found for this or any previous visit. No results found for this or any previous visit. No results found for this or any previous visit. No results found for this or any previous visit. Results for orders placed during the hospital encounter of 03/09/19  US RENAL   Narrative CLINICAL DATA:  73 year old female with a history of flank pain  EXAM: RENAL / URINARY TRACT ULTRASOUND COMPLETE  COMPARISON:  CT 02/14/2019  FINDINGS: Right Kidney:  Length: 11.1 cm x 4.6 cm x 5.1 cm, 134 cc. Echogenicity similar to that of the adjacent liver. No hydronephrosis. Cystic lesion measures 1.2 cm x 1.2 cm x 0.9 cm at  the lower pole, similar to that on the prior CT.  Left Kidney:  Length: 10.1 cm x 4.5 cm x 5.0 cm, 126 cc. Echogenicity similar to that of the contralateral kidney. No hydronephrosis.  Bladder:  Decompressed urinary bladder  IMPRESSION: Sonographic survey demonstrates no evidence of hydronephrosis.  Right-sided Bosniak 1 cyst   Electronically Signed   By: Corrie Mckusick D.O.   On: 03/09/2019 15:24    No results found for this or any previous visit. No results found for this or any previous visit. Results for orders placed during the hospital encounter of 02/14/19  CT Renal Stone Study   Narrative CLINICAL DATA:  Left-sided flank pain. Fever. Nephrolithiasis. Urinary tract infection.  EXAM: CT ABDOMEN AND PELVIS WITHOUT CONTRAST  TECHNIQUE: Multidetector CT imaging of the abdomen and pelvis was performed following the standard protocol without IV contrast.  COMPARISON:  None.  FINDINGS: Lower chest: No acute findings.  Hepatobiliary: No mass visualized on this unenhanced exam. Gallbladder is unremarkable.  Pancreas: No mass or inflammatory process visualized on this unenhanced exam.  Spleen:  Within normal limits in size.  Adrenals/Urinary tract: No evidence of urolithiasis or hydronephrosis. Mild unilateral left perinephric stranding is seen,, suspicious for pyelonephritis. Unremarkable unopacified urinary bladder.  Stomach/Bowel: No evidence of obstruction, inflammatory process, or abnormal fluid collections. Normal appendix visualized. Diverticulosis is seen mainly involving the sigmoid colon, however there is no evidence of diverticulitis.  Vascular/Lymphatic: No pathologically enlarged lymph nodes identified. No evidence of abdominal aortic aneurysm. Aortic atherosclerosis.  Reproductive: Prior hysterectomy noted. Adnexal regions are unremarkable in appearance.  Other:  None.  Musculoskeletal:  No suspicious bone lesions identified.   IMPRESSION: 1. Mild left perinephric stranding, suspicious for pyelonephritis. 2. No evidence of urolithiasis or hydronephrosis. 3. Colonic diverticulosis, without radiographic evidence of diverticulitis.  Aortic Atherosclerosis (ICD10-I70.0).   Electronically Signed   By: Earle Gell M.D.   On: 02/14/2019 09:13     Assessment & Plan:    1. Recurrent UTI She has no functional or anatomic abnormalities that would put her at risk for recurrent urinary tract infection.  She has had adequate imaging.  She had a pyelonephritis and some rebound urinary tract infections which are not uncommon as they tend to cluster. She is emptying her bladder. UA clear today.   - Urinalysis, Complete - Bladder Scan (Post Void Residual) in office  2. Gross hematuria - we discussed the kidney cyst. Her dad had PCKD and we discussed the difference. We discussed repeat imaging with MRI with contrast (she is allergic IV contrast CT) or consider RGP -  She will continue surveillance. Hematuria was during a UTI episode and resolved.  We will have a low threshold to scan her again.  3. Renal cyst - as above   No follow-ups on file.  Festus Aloe, MD  [facility] [address] [place], [place] [address] [phone]

## 2019-04-29 NOTE — Patient Instructions (Signed)
Hematuria, Adult Hematuria is blood in the urine. Blood may be visible in the urine, or it may be identified with a test. This condition can be caused by infections of the bladder, urethra, kidney, or prostate. Other possible causes include:  Kidney stones.  Cancer of the urinary tract.  Too much calcium in the urine.  Conditions that are passed from parent to child (inherited conditions).  Exercise that requires a lot of energy. Infections can usually be treated with medicine, and a kidney stone usually will pass through your urine. If neither of these is the cause of your hematuria, more tests may be needed to identify the cause of your symptoms. It is very important to tell your health care provider about any blood in your urine, even if it is painless or the blood stops without treatment. Blood in the urine, when it happens and then stops and then happens again, can be a symptom of a very serious condition, including cancer. There is no pain in the initial stages of many urinary cancers. Follow these instructions at home: Medicines  Take over-the-counter and prescription medicines only as told by your health care provider.  If you were prescribed an antibiotic medicine, take it as told by your health care provider. Do not stop taking the antibiotic even if you start to feel better. Eating and drinking  Drink enough fluid to keep your urine clear or pale yellow. It is recommended that you drink 3-4 quarts (2.8-3.8 L) a day. If you have been diagnosed with an infection, it is recommended that you drink cranberry juice in addition to large amounts of water.  Avoid caffeine, tea, and carbonated beverages. These tend to irritate the bladder.  Avoid alcohol because it may irritate the prostate (men). General instructions  If you have been diagnosed with a kidney stone, follow your health care provider's instructions about straining your urine to catch the stone.  Empty your bladder  often. Avoid holding urine for long periods of time.  If you are female: ? After a bowel movement, wipe from front to back and use each piece of toilet paper only once. ? Empty your bladder before and after sex.  Pay attention to any changes in your symptoms. Tell your health care provider about any changes or any new symptoms.  It is your responsibility to get your test results. Ask your health care provider, or the department performing the test, when your results will be ready.  Keep all follow-up visits as told by your health care provider. This is important. Contact a health care provider if:  You develop back pain.  You have a fever.  You have nausea or vomiting.  Your symptoms do not improve after 3 days.  Your symptoms get worse. Get help right away if:  You develop severe vomiting and are unable take medicine without vomiting.  You develop severe pain in your back or abdomen even though you are taking medicine.  You pass a large amount of blood in your urine.  You pass blood clots in your urine.  You feel very weak or like you might faint.  You faint. Summary  Hematuria is blood in the urine. It has many possible causes.  It is very important that you tell your health care provider about any blood in your urine, even if it is painless or the blood stops without treatment.  Take over-the-counter and prescription medicines only as told by your health care provider.  Drink enough fluid to keep   your urine clear or pale yellow. This information is not intended to replace advice given to you by your health care provider. Make sure you discuss any questions you have with your health care provider. Document Released: 08/20/2005 Document Revised: 08/02/2017 Document Reviewed: 09/22/2016 Elsevier Patient Education  2020 Elsevier Inc.  

## 2019-05-08 ENCOUNTER — Ambulatory Visit: Payer: Self-pay | Admitting: Urology

## 2019-05-19 ENCOUNTER — Ambulatory Visit (INDEPENDENT_AMBULATORY_CARE_PROVIDER_SITE_OTHER): Payer: Medicare Other | Admitting: Physician Assistant

## 2019-05-19 ENCOUNTER — Encounter: Payer: Self-pay | Admitting: Physician Assistant

## 2019-05-19 ENCOUNTER — Other Ambulatory Visit: Payer: Self-pay

## 2019-05-19 VITALS — BP 138/64 | HR 77 | Ht 65.0 in | Wt 169.5 lb

## 2019-05-19 DIAGNOSIS — Z87440 Personal history of urinary (tract) infections: Secondary | ICD-10-CM

## 2019-05-19 DIAGNOSIS — N3001 Acute cystitis with hematuria: Secondary | ICD-10-CM | POA: Diagnosis not present

## 2019-05-19 DIAGNOSIS — R300 Dysuria: Secondary | ICD-10-CM | POA: Diagnosis not present

## 2019-05-19 LAB — URINALYSIS, COMPLETE
Bilirubin, UA: NEGATIVE
Glucose, UA: NEGATIVE
Ketones, UA: NEGATIVE
Nitrite, UA: NEGATIVE
Protein,UA: NEGATIVE
Specific Gravity, UA: <1.005 — ABNORMAL LOW (ref 1.005–1.030)
Urobilinogen, Ur: 0.2 mg/dL (ref 0.2–1.0)
pH, UA: 5 (ref 5.0–7.5)

## 2019-05-19 LAB — MICROSCOPIC EXAMINATION

## 2019-05-19 MED ORDER — NITROFURANTOIN MONOHYD MACRO 100 MG PO CAPS: 100.0000 mg | ORAL_CAPSULE | Freq: Two times a day (BID) | ORAL | 0 refills | Status: AC

## 2019-05-19 NOTE — Progress Notes (Signed)
05/19/2019 8:05 AM   Mary Vega 12/09/1945 [id number]  CC: Dysuria  HPI: Mary Vega is a 73 y.o. female who presents today for evaluation of possible UTI. She is an established [facility] patient who last saw Dr. Junious Silk on 04/29/2019 for evaluation of recurrent UTI with a history of pyelonephritis.  She reports a 1-day history of dysuria, urgency, and frequency. She denies fevers, chills, nausea, vomiting, flank pain, and gross hematuria.  Recent medical history significant for culture negative pyelonephritis in June 2020. She was admitted for IV antibiotics (Rocephin, Omnicef) after failing outpatient therapy. She was hospitalized again in July 2020, this time with E coli UTI, and treated with Rocephin, ciprofloxacin, and cephalexin.  This would be her fourth reported urinary infection this year; she states she did not experience UTIs before this year. She does report significant constipation at baseline despite therapy with stool softeners and laxatives. She has used Miralax short term in the past and tolerated it well. She is taking cranberry supplements daily in the morning after breakfast.  In-office UA today positive for 2+ blood, 1+ leukocyte esterase; urine microscopy with 11-30 WBCs/HPF and 3-10 RBCs/HPF.  She is allergic to sulfa.  PMH: Past Medical History:  Diagnosis Date  . Anxiety    takes Lorazepam daily  . Chronic lower back pain    buldging disc  . Constipation    takes Stool Softener daily as needed  . Depression    takes Zoloft daily  . GERD (gastroesophageal reflux disease)    takes Omeprazole daily  . History of blood transfusion    "when I had cancer surgery; hysterectomy"  . History of bronchitis    Sept 2016  . History of colon polyps    benign  . Hyperlipidemia    takes Simvastatin daily  . Hypertension    takes Losartan daily   . Insomnia    takes Trazodone nightly  . Kidney stone    "must have passed it"  . OSA on CPAP   . Ovarian  cancer ([place])   . Peripheral edema    takes Spironolactone daily  . Peripheral neuropathy   . Pneumonia ~ 2012  . PONV (postoperative nausea and vomiting)   . Sinus headache   . Urinary frequency   . UTI (lower urinary tract infection)    being treated with Macrobid,completed today 11/11/15    Surgical History: Past Surgical History:  Procedure Laterality Date  . ABDOMINAL HYSTERECTOMY    . CATARACT EXTRACTION W/ INTRAOCULAR LENS  IMPLANT, BILATERAL Bilateral   . COLONOSCOPY    . DIAGNOSTIC LAPAROSCOPY     to drain cyst on ovary  . THYROID LOBECTOMY Right 11/17/2015  . THYROID SURGERY  X 2   goiter removed  . THYROIDECTOMY Right 11/17/2015   Procedure: RIGHT THYROID LOBECTOMY WITH FROZEN SECTION;  Surgeon: Melissa Montane, MD;  Location: [facility];  Service: ENT;  Laterality: Right;    Home Medications:  Allergies as of 05/19/2019      Reactions   Iodine-131 Anaphylaxis, Itching   Ivp Dye [iodinated Diagnostic Agents] Anaphylaxis   Sulfa Antibiotics Hives, Other (See Comments)   Septic?   Sulfamethoxazole Hives, Rash   Clams [shellfish Allergy] Diarrhea, Nausea And Vomiting   Phenobarbital Other (See Comments)   Tremor (intolerance)      Medication List       Accurate as of May 19, 2019 11:59 PM. If you have any questions, ask your nurse or doctor.  amLODipine 2.5 MG tablet Commonly known as: NORVASC Take 2.5 mg by mouth daily.   docusate sodium 100 MG capsule Commonly known as: COLACE Take 100 mg by mouth daily as needed for mild constipation.   famotidine 20 MG tablet Commonly known as: PEPCID Take 20 mg by mouth 2 (two) times daily.   LORazepam 1 MG tablet Commonly known as: ATIVAN Take 1 mg by mouth every 6 (six) hours as needed for anxiety.   multivitamin with minerals Tabs tablet Take 1 tablet by mouth daily.   nitrofurantoin (macrocrystal-monohydrate) 100 MG capsule Commonly known as: MACROBID Take 1 capsule (100 mg total) by mouth every 12  (twelve) hours for 7 days. Started by: Debroah Loop, PA-C   rosuvastatin 10 MG tablet Commonly known as: CRESTOR Take 10 mg by mouth daily.   sertraline 100 MG tablet Commonly known as: ZOLOFT Take 100 mg by mouth daily.   simvastatin 40 MG tablet Commonly known as: ZOCOR Take 40 mg by mouth at bedtime.   spironolactone 25 MG tablet Commonly known as: ALDACTONE Take 25 mg by mouth 2 (two) times daily.   telmisartan 80 MG tablet Commonly known as: MICARDIS Take 80 mg by mouth daily.   traZODone 100 MG tablet Commonly known as: DESYREL Take 200 mg by mouth at bedtime.   triamcinolone 0.025 % cream Commonly known as: KENALOG       Allergies:  Allergies  Allergen Reactions  . Iodine-131 Anaphylaxis and Itching  . Ivp Dye [Iodinated Diagnostic Agents] Anaphylaxis  . Sulfa Antibiotics Hives and Other (See Comments)    Septic?  . Sulfamethoxazole Hives and Rash  . Clams [Shellfish Allergy] Diarrhea and Nausea And Vomiting  . Phenobarbital Other (See Comments)    Tremor (intolerance)    Family History: No family history on file.  Social History:   reports that she has quit smoking. Her smoking use included cigarettes. She has a 23.50 pack-year smoking history. She has never used smokeless tobacco. She reports that she does not drink alcohol or use drugs.  ROS: UROLOGY Frequent Urination?: No Hard to postpone urination?: No Burning/pain with urination?: Yes Get up at night to urinate?: No Leakage of urine?: No Urine stream starts and stops?: Yes Trouble starting stream?: No Do you have to strain to urinate?: Yes Blood in urine?: No Urinary tract infection?: Yes Sexually transmitted disease?: No Injury to kidneys or bladder?: No Painful intercourse?: No Weak stream?: No Currently pregnant?: No Vaginal bleeding?: No Last menstrual period?: n  Gastrointestinal Nausea?: No Vomiting?: No Indigestion/heartburn?: No Diarrhea?: No Constipation?: No   Constitutional Fever: No Night sweats?: No Weight loss?: No Fatigue?: No  Skin Skin rash/lesions?: No Itching?: No  Eyes Blurred vision?: No Double vision?: No  Ears/Nose/Throat Sore throat?: No Sinus problems?: No  Hematologic/Lymphatic Swollen glands?: No Easy bruising?: No  Cardiovascular Leg swelling?: No Chest pain?: No  Respiratory Cough?: No Shortness of breath?: No  Endocrine Excessive thirst?: No  Musculoskeletal Back pain?: Yes Joint pain?: Yes  Neurological Headaches?: No Dizziness?: No  Psychologic Depression?: No Anxiety?: Yes  Physical Exam: BP 138/64 (BP Location: Left Arm, Patient Position: Sitting, Cuff Size: Normal)   Pulse 77   Ht 5\' 5"  (1.651 m)   Wt 169 lb 8 oz (76.9 kg)   BMI 28.21 kg/m   Constitutional:  Alert and oriented, no acute distress, nontoxic appearing HEENT: [place], AT Cardiovascular: No clubbing, cyanosis, or edema Respiratory: Normal respiratory effort, no increased work of breathing Skin: No rashes, bruises or  suspicious lesions Neurologic: Grossly intact, no focal deficits, moving all 4 extremities Psychiatric: Normal mood and affect  Laboratory Data: Results for orders placed or performed in visit on 05/19/19  Microscopic Examination   URINE  Result Value Ref Range   WBC, UA 11-30 (A) 0 - 5 /hpf   RBC 3-10 (A) 0 - 2 /hpf   Epithelial Cells (non renal) 0-10 0 - 10 /hpf   Renal Epithel, UA 0-10 (A) None seen /hpf   Bacteria, UA Few None seen/Few  Urinalysis, Complete  Result Value Ref Range   Specific Gravity, UA <1.005 (L) 1.005 - 1.030   pH, UA 5.0 5.0 - 7.5   Color, UA Yellow Yellow   Appearance Ur Hazy (A) Clear   Leukocytes,UA 1+ (A) Negative   Protein,UA Negative Negative/Trace   Glucose, UA Negative Negative   Ketones, UA Negative Negative   RBC, UA 2+ (A) Negative   Bilirubin, UA Negative Negative   Urobilinogen, Ur 0.2 0.2 - 1.0 mg/dL   Nitrite, UA Negative Negative   Microscopic  Examination See below:    Assessment & Plan:   1. Dysuria Patient with symptoms and UA consistent with acute UTI. Will send for culture to confirm and start treatment today given patient's recent history of complicated, treatment-resistant urinary infection. No signs of upper tract involvement at this time, will treat with nitrofurantoin based on the results of her most recent available urine culture. Extending therapy from 5-7 days given complicated infx history. - Urinalysis, Complete - CULTURE, URINE COMPREHENSIVE - nitrofurantoin, macrocrystal-monohydrate, (MACROBID) 100 MG capsule; Take 1 capsule (100 mg total) by mouth every 12 (twelve) hours for 7 days.  Dispense: 14 capsule; Refill: 0  2. Acute cystitis with hematuria Patient with microscopic hematuria on UA today. Will need follow-up UA after treatment to prove resolution. She has a scheduled cystoscopy with Dr. Junious Silk on 05/27/2019; will order this to be completed at that visit.  3. History of recurrent UTI (urinary tract infection) Discussed prevention strategies for recurrent UTI today. I recommended she start a daily probiotic containing lactobacillus, switch her cranberry supplementation to nighttime on an empty stomach, and augment her daily bowel regimen with Miralax. She expressed an understanding of this plan.  Debroah Loop, PA-C  [facility] [address], [place], [place] [address] [phone]

## 2019-05-19 NOTE — Patient Instructions (Signed)
1. Stay well hydrated. 2. Start taking Miralax to manage your constipation, with the goal of having formed bowel movements that are easy for you to pass. You may adjust the recommended dose of one capful daily to achieve this goal. 3. Start taking an over-the-counter cranberry supplement for urinary tract health. Take this once or twice daily on an empty stomach, e.g. right before bed. 4. Start taking an over-the-counter probiotic, preferably containing lactobacillus. Take this daily.

## 2019-05-21 LAB — CULTURE, URINE COMPREHENSIVE

## 2019-05-27 ENCOUNTER — Encounter: Payer: Self-pay | Admitting: Urology

## 2019-05-27 ENCOUNTER — Ambulatory Visit: Payer: Medicare Other | Admitting: Urology

## 2019-05-27 ENCOUNTER — Other Ambulatory Visit: Payer: Self-pay

## 2019-05-27 VITALS — BP 147/69 | HR 72 | Ht 65.0 in | Wt 170.0 lb

## 2019-05-27 DIAGNOSIS — N952 Postmenopausal atrophic vaginitis: Secondary | ICD-10-CM

## 2019-05-27 DIAGNOSIS — R300 Dysuria: Secondary | ICD-10-CM

## 2019-05-27 DIAGNOSIS — N3001 Acute cystitis with hematuria: Secondary | ICD-10-CM

## 2019-05-27 LAB — URINALYSIS, COMPLETE
Bilirubin, UA: NEGATIVE
Glucose, UA: NEGATIVE
Ketones, UA: NEGATIVE
Leukocytes,UA: NEGATIVE
Nitrite, UA: NEGATIVE
Protein,UA: NEGATIVE
Specific Gravity, UA: <1.005 — ABNORMAL LOW (ref 1.005–1.030)
Urobilinogen, Ur: 0.2 mg/dL (ref 0.2–1.0)
pH, UA: 5.5 (ref 5.0–7.5)

## 2019-05-27 LAB — MICROSCOPIC EXAMINATION
Bacteria, UA: NONE SEEN
Epithelial Cells (non renal): NONE SEEN /hpf (ref 0–10)
RBC, Urine: NONE SEEN /hpf (ref 0–2)

## 2019-05-27 MED ORDER — TRIMETHOPRIM 100 MG PO TABS: 100.0000 mg | ORAL_TABLET | Freq: Every day | ORAL | 1 refills | Status: DC

## 2019-05-27 NOTE — Progress Notes (Signed)
   05/27/19  CC: No chief complaint on file.  Quintasha returns for cystoscopy and exam.  She was seen for recurrent urinary tract August 2020.  Urine cultures grew E. coli.  June 2020 CT scan revealed stranding around the left kidney consistent with pyelonephritis.  Follow-up renal ultrasound 03/2019 was benign.  Bladder not distended.  She has significant constipation.  Normal kidney function.  Post void 79 mL.  She had complete hx for Ov Ca in 2004. She had chemo.   She was seen again with dysuria last week and urine culture grew E. coli.  She continues antibiotics, but has only been taking NF nightly. UA clear. Antibiotics improve her symptoms.  She has been drinking more water, taking probiotics and taking cranberry supplements.  HPI:  There were no vitals taken for this visit. NED. A&Ox3.   No respiratory distress   Abd soft, NT, ND Normal external genitalia with patent urethral meatus. Moderate atrophy.   Sarah-chaperone for exam and cystoscopy  Cystoscopy Procedure Note  Patient identification was confirmed, informed consent was obtained, and patient was prepped using Betadine solution.  Lidocaine jelly was administered per urethral meatus.    Procedure: - Flexible cystoscope introduced, without any difficulty.   - Thorough search of the bladder revealed:    normal urethral meatus    normal urothelium    no stones    no ulcers     no tumors    no urethral polyps    no trabeculation  - Ureteral orifices were normal in position and appearance.  Post-Procedure: - Patient tolerated the procedure well  Assessment/ Plan:  UTI -  Start TMP prophylaxis in hopes there is enough sensitivity to be effective (e coli is very resistant). I'd like to avoid NF to keep it effective for future use.  Normal exam today no evidence of urethral diverticulum. Consider Luvena vaginal moisturizer.    No follow-ups on file.  Festus Aloe, MD

## 2019-05-27 NOTE — Patient Instructions (Addendum)
Urinary Tract Infection, Adult A urinary tract infection (UTI) is an infection of any part of the urinary tract. The urinary tract includes:  The kidneys.  The ureters.  The bladder.  The urethra. These organs make, store, and get rid of pee (urine) in the body.  Consider Luvena vaginal moisturizer .  What are the causes? This is caused by germs (bacteria) in your genital area. These germs grow and cause swelling (inflammation) of your urinary tract. What increases the risk? You are more likely to develop this condition if:  You have a small, thin tube (catheter) to drain pee.  You cannot control when you pee or poop (incontinence).  You are female, and: ? You use these methods to prevent pregnancy: ? A medicine that kills sperm (spermicide). ? A device that blocks sperm (diaphragm). ? You have low levels of a female hormone (estrogen). ? You are pregnant.  You have genes that add to your risk.  You are sexually active.  You take antibiotic medicines.  You have trouble peeing because of: ? A prostate that is bigger than normal, if you are female. ? A blockage in the part of your body that drains pee from the bladder (urethra). ? A kidney stone. ? A nerve condition that affects your bladder (neurogenic bladder). ? Not getting enough to drink. ? Not peeing often enough.  You have other conditions, such as: ? Diabetes. ? A weak disease-fighting system (immune system). ? Sickle cell disease. ? Gout. ? Injury of the spine. What are the signs or symptoms? Symptoms of this condition include:  Needing to pee right away (urgently).  Peeing often.  Peeing small amounts often.  Pain or burning when peeing.  Blood in the pee.  Pee that smells bad or not like normal.  Trouble peeing.  Pee that is cloudy.  Fluid coming from the vagina, if you are female.  Pain in the belly or lower back. Other symptoms include:  Throwing up (vomiting).  No urge to eat.   Feeling mixed up (confused).  Being tired and grouchy (irritable).  A fever.  Watery poop (diarrhea). How is this treated? This condition may be treated with:  Antibiotic medicine.  Other medicines.  Drinking enough water. Follow these instructions at home:  Medicines  Take over-the-counter and prescription medicines only as told by your doctor.  If you were prescribed an antibiotic medicine, take it as told by your doctor. Do not stop taking it even if you start to feel better. General instructions  Make sure you: ? Pee until your bladder is empty. ? Do not hold pee for a long time. ? Empty your bladder after sex. ? Wipe from front to back after pooping if you are a female. Use each tissue one time when you wipe.  Drink enough fluid to keep your pee pale yellow.  Keep all follow-up visits as told by your doctor. This is important. Contact a doctor if:  You do not get better after 1-2 days.  Your symptoms go away and then come back. Get help right away if:  You have very bad back pain.  You have very bad pain in your lower belly.  You have a fever.  You are sick to your stomach (nauseous).  You are throwing up. Summary  A urinary tract infection (UTI) is an infection of any part of the urinary tract.  This condition is caused by germs in your genital area.  There are many risk factors for a  UTI. These include having a small, thin tube to drain pee and not being able to control when you pee or poop.  Treatment includes antibiotic medicines for germs.  Drink enough fluid to keep your pee pale yellow. This information is not intended to replace advice given to you by your health care provider. Make sure you discuss any questions you have with your health care provider. Document Released: 02/06/2008 Document Revised: 08/07/2018 Document Reviewed: 02/27/2018 Elsevier Patient Education  2020 [employer].

## 2019-06-16 IMAGING — CT CT RENAL STONE PROTOCOL
2 of 4 series · 16 of 46 positions shown, 18 images · non-contrast
Comparison: None.

CLINICAL DATA: Left-sided flank pain. Fever. Nephrolithiasis.
Urinary tract infection.

EXAM:
CT ABDOMEN AND PELVIS WITHOUT CONTRAST
TECHNIQUE: Multidetector CT imaging of the abdomen and pelvis was performed
following the standard protocol without IV contrast.

[Series 2: stone full standard · axial · 0.83mm/px · z∈[-548,-148]mm · 13 of 88 slices shown, 15 images]
[im 4/88  soft-tissue]
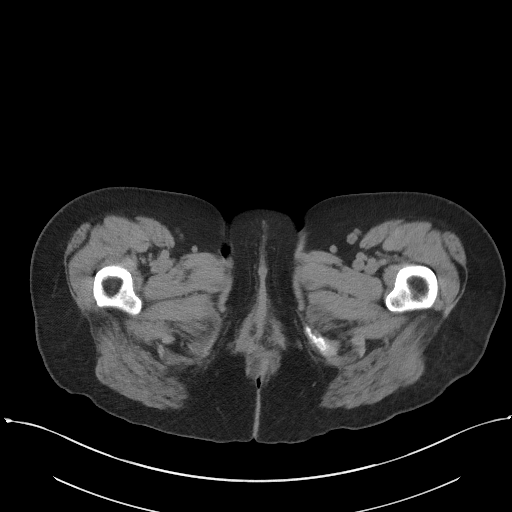
[im 4/88  bone]
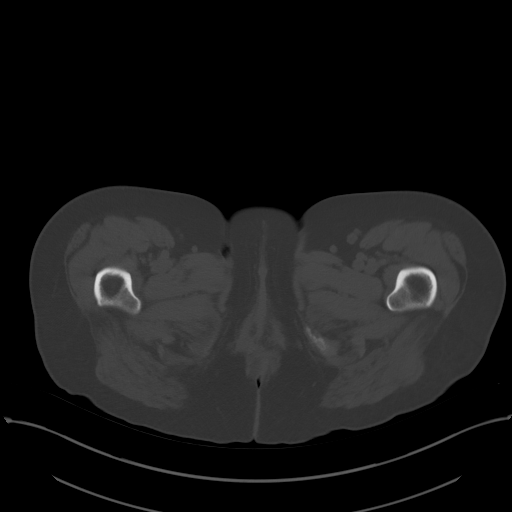
[im 11/88  soft-tissue]
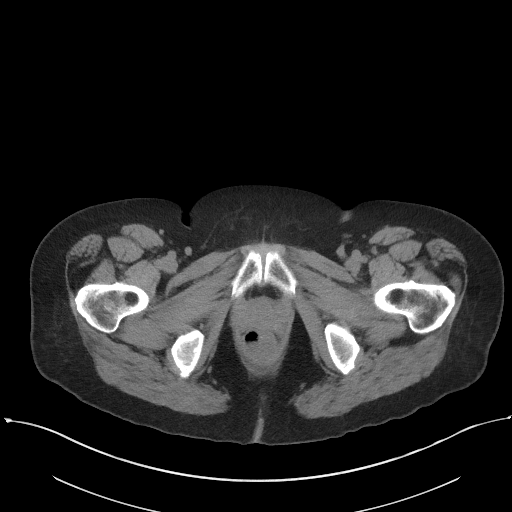
[im 17/88  soft-tissue]
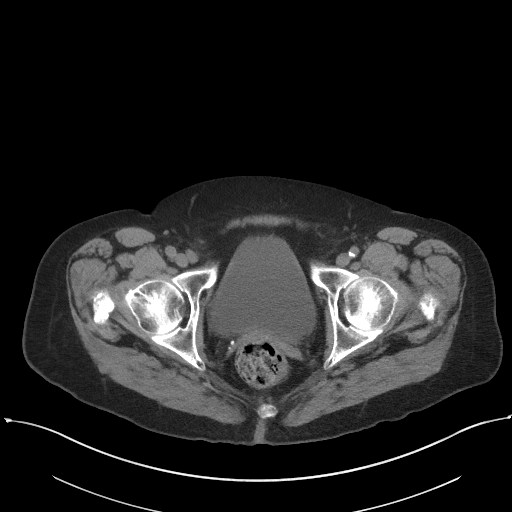
[im 24/88  soft-tissue]
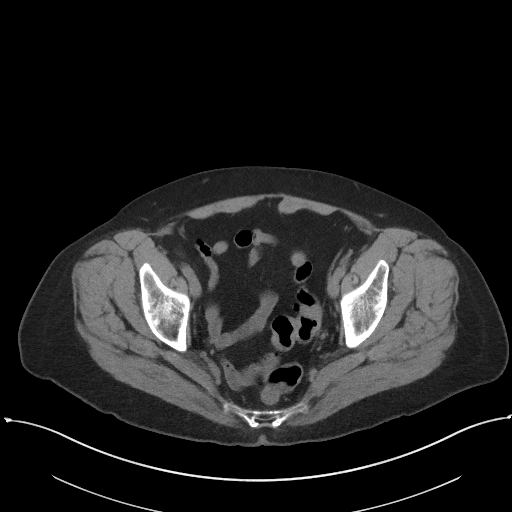
[im 31/88  soft-tissue]
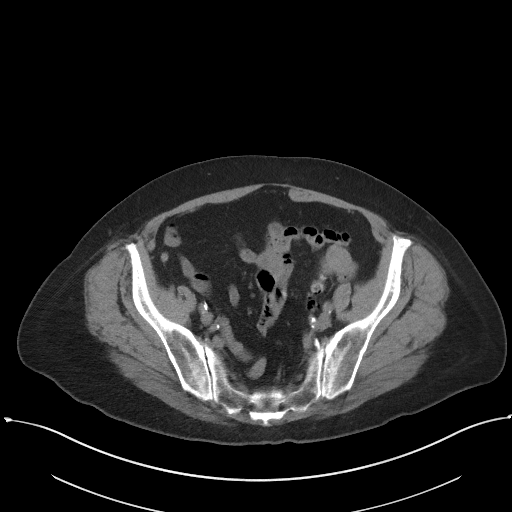
[im 37/88  soft-tissue]
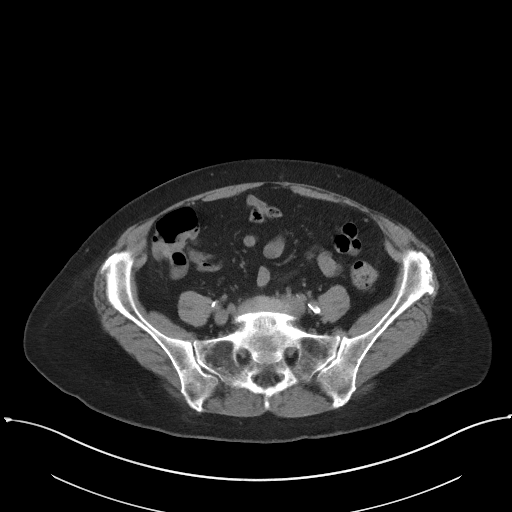
[im 44/88  soft-tissue]
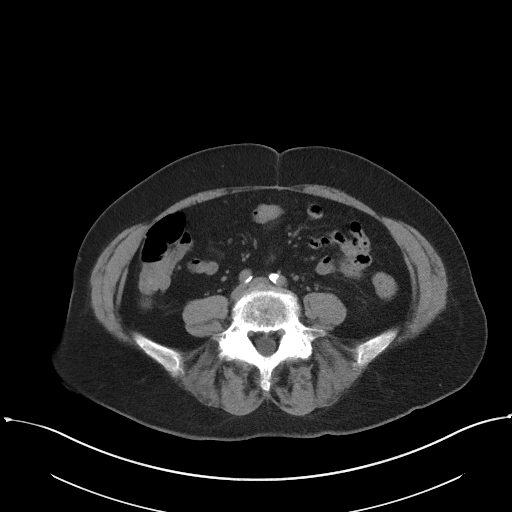
[im 51/88  soft-tissue]
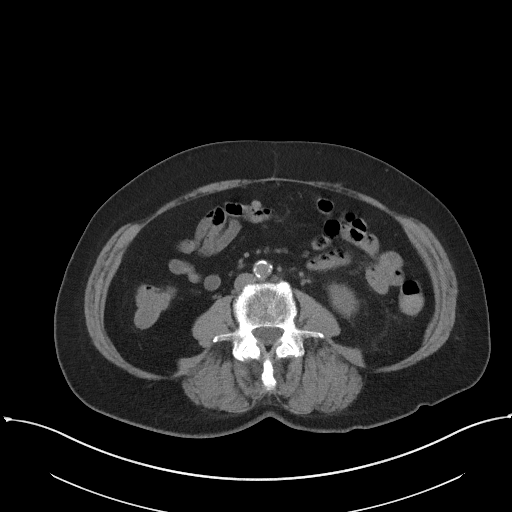
[im 57/88  soft-tissue]
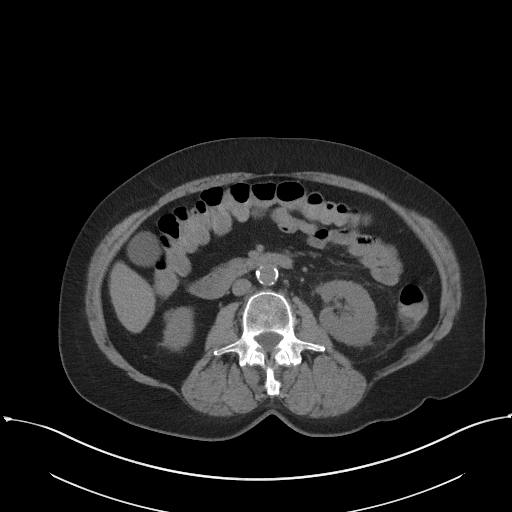
[im 57/88  bone]
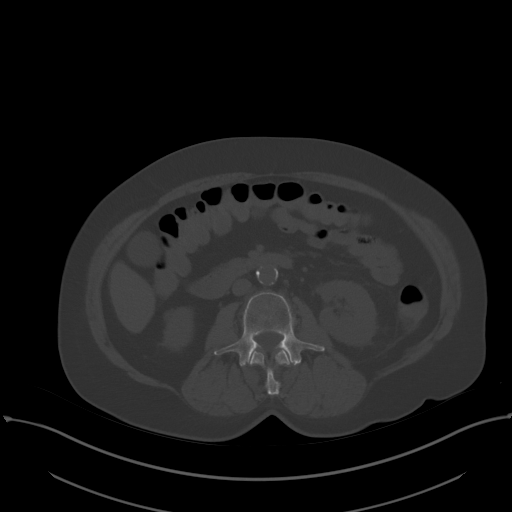
[im 64/88  soft-tissue]
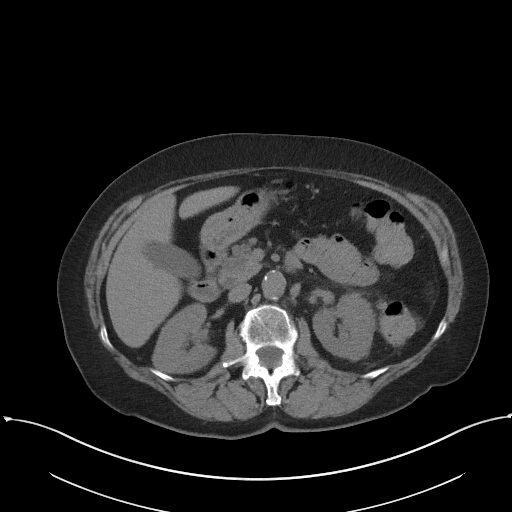
[im 71/88  soft-tissue]
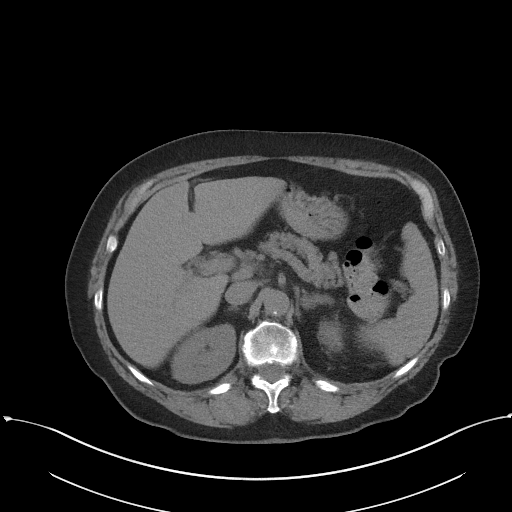
[im 77/88  soft-tissue]
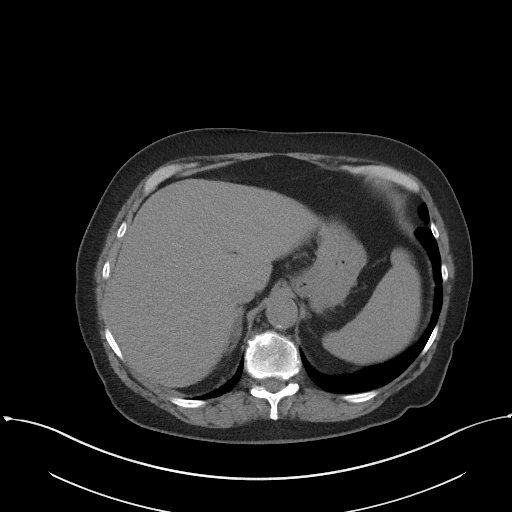
[im 84/88  soft-tissue]
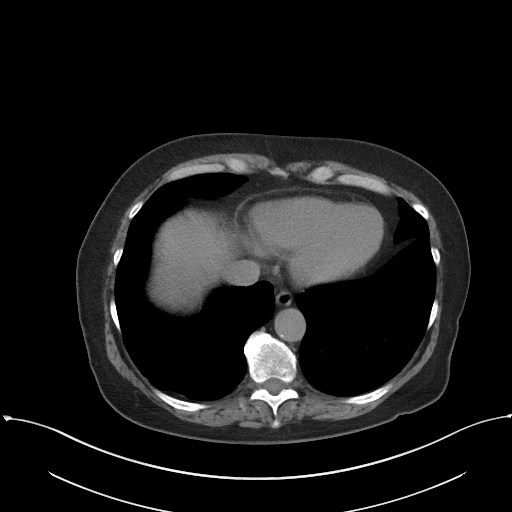

[Series 5: coronal · coronal · 0.74mm/px · 3 of 135 slices shown]
[im 45/135  soft-tissue]
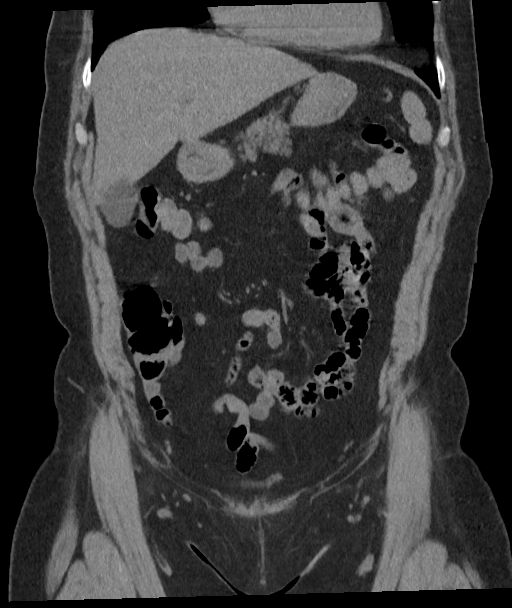
[im 60/135  soft-tissue]
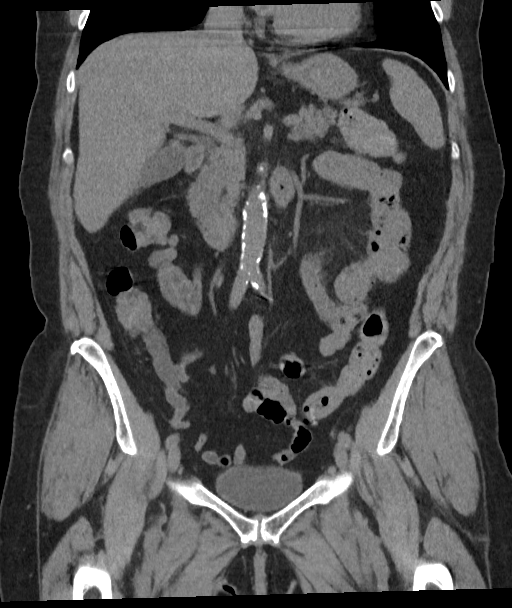
[im 75/135  soft-tissue]
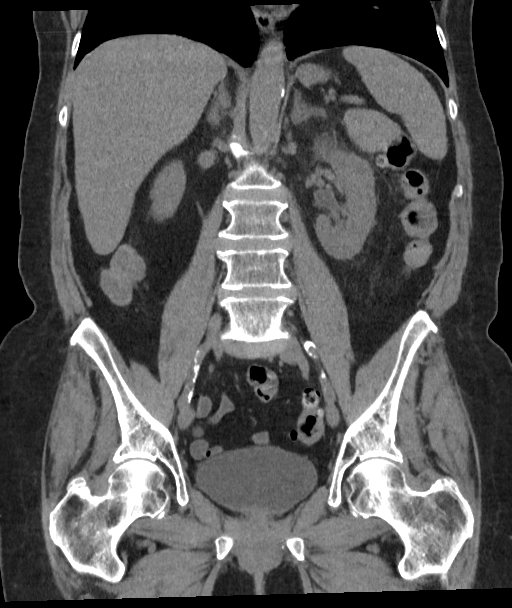

[16 of 46 positions shown; findings below may reference images not displayed]

FINDINGS: Lower chest: No acute findings.

Hepatobiliary: No mass visualized on this unenhanced exam.
Gallbladder is unremarkable.

Pancreas: No mass or inflammatory process visualized on this
unenhanced exam.

Spleen:  Within normal limits in size.

Adrenals/Urinary tract: No evidence of urolithiasis or
hydronephrosis. Mild unilateral left perinephric stranding is seen,,
suspicious for pyelonephritis. Unremarkable unopacified urinary
bladder.

Stomach/Bowel: No evidence of obstruction, inflammatory process, or
abnormal fluid collections. Normal appendix visualized.
Diverticulosis is seen mainly involving the sigmoid colon, however
there is no evidence of diverticulitis.

Vascular/Lymphatic: No pathologically enlarged lymph nodes
identified. No evidence of abdominal aortic aneurysm. Aortic
atherosclerosis.

Reproductive: Prior hysterectomy noted. Adnexal regions are
unremarkable in appearance.

Other:  None.

Musculoskeletal:  No suspicious bone lesions identified.
IMPRESSION: 1. Mild left perinephric stranding, suspicious for pyelonephritis.
2. No evidence of urolithiasis or hydronephrosis.
3. Colonic diverticulosis, without radiographic evidence of
diverticulitis.

Aortic Atherosclerosis ([id number]).

## 2019-08-19 ENCOUNTER — Encounter: Payer: Self-pay | Admitting: Urology

## 2019-08-19 ENCOUNTER — Other Ambulatory Visit: Payer: Self-pay

## 2019-08-19 ENCOUNTER — Ambulatory Visit: Payer: Medicare Other | Admitting: Urology

## 2019-08-19 VITALS — BP 131/76 | HR 71 | Ht 65.0 in | Wt 166.6 lb

## 2019-08-19 DIAGNOSIS — R300 Dysuria: Secondary | ICD-10-CM | POA: Diagnosis not present

## 2019-08-19 DIAGNOSIS — N3020 Other chronic cystitis without hematuria: Secondary | ICD-10-CM | POA: Diagnosis not present

## 2019-08-19 LAB — URINALYSIS, COMPLETE
Bilirubin, UA: NEGATIVE
Glucose, UA: NEGATIVE
Ketones, UA: NEGATIVE
Leukocytes,UA: NEGATIVE
Nitrite, UA: NEGATIVE
Protein,UA: NEGATIVE
Specific Gravity, UA: 1.015 (ref 1.005–1.030)
Urobilinogen, Ur: 0.2 mg/dL (ref 0.2–1.0)
pH, UA: 5.5 (ref 5.0–7.5)

## 2019-08-19 LAB — MICROSCOPIC EXAMINATION
Bacteria, UA: NONE SEEN
WBC, UA: NONE SEEN /hpf (ref 0–5)

## 2019-08-19 NOTE — Progress Notes (Signed)
08/19/2019 9:47 AM   Mary Vega 10/11/1945 [id number]  Referring provider: Nicola Girt, DO [id number] [place],  [place] [address]  No chief complaint on file.   HPI:  F/u -   1) Recurrent urinary tract --  August 2020 - dysuria, intermittent stream. PVR 79 ml. No NG risk. Urine cultures grew a resistant E. coli.  02/2019 CT scan revealed stranding around the left kidney consistent with pyelonephritis.  Follow-up renal ultrasound 03/2019 was benign.  Bladder not distended.  She has significant constipation.  Normal kidney function.  Post void 79 mL.  She had complete hx for Ov Ca in 2004. She had chemo. She tried nightly NF. She increased water, decreased soda, took probiotics and cranberry supplements. Cystoscopy/exam benign 05/2019.   Today, she is well. No dysuria or gross hematuria. Sometimes interrupted stream or straining to void. She has issues with constipation and manageable. No gross hematuria. She continues nightly TMP.    PMH: Past Medical History:  Diagnosis Date  . Anxiety    takes Lorazepam daily  . Chronic lower back pain    buldging disc  . Constipation    takes Stool Softener daily as needed  . Depression    takes Zoloft daily  . GERD (gastroesophageal reflux disease)    takes Omeprazole daily  . History of blood transfusion    "when I had cancer surgery; hysterectomy"  . History of bronchitis    Sept 2016  . History of colon polyps    benign  . Hyperlipidemia    takes Simvastatin daily  . Hypertension    takes Losartan daily   . Insomnia    takes Trazodone nightly  . Kidney stone    "must have passed it"  . OSA on CPAP   . Ovarian cancer ([facility])   . Peripheral edema    takes Spironolactone daily  . Peripheral neuropathy   . Pneumonia ~ 2012  . PONV (postoperative nausea and vomiting)   . Sinus headache   . Urinary frequency   . UTI (lower urinary tract infection)    being treated with Macrobid,completed today  11/11/15    Surgical History: Past Surgical History:  Procedure Laterality Date  . ABDOMINAL HYSTERECTOMY    . CATARACT EXTRACTION W/ INTRAOCULAR LENS  IMPLANT, BILATERAL Bilateral   . COLONOSCOPY    . DIAGNOSTIC LAPAROSCOPY     to drain cyst on ovary  . THYROID LOBECTOMY Right 11/17/2015  . THYROID SURGERY  X 2   goiter removed  . THYROIDECTOMY Right 11/17/2015   Procedure: RIGHT THYROID LOBECTOMY WITH FROZEN SECTION;  Surgeon: Melissa Montane, MD;  Location: [facility];  Service: ENT;  Laterality: Right;    Home Medications:  Allergies as of 08/19/2019      Reactions   Iodine-131 Anaphylaxis, Itching   Ivp Dye [iodinated Diagnostic Agents] Anaphylaxis   Sulfa Antibiotics Hives, Other (See Comments)   Septic?   Sulfamethoxazole Hives, Rash   Clams [shellfish Allergy] Diarrhea, Nausea And Vomiting   Phenobarbital Other (See Comments)   Tremor (intolerance)      Medication List       Accurate as of August 19, 2019  9:47 AM. If you have any questions, ask your nurse or doctor.        amLODipine 2.5 MG tablet Commonly known as: NORVASC Take 2.5 mg by mouth daily.   famotidine 20 MG tablet Commonly known as: PEPCID Take 20 mg by mouth 2 (two) times  daily.   LORazepam 1 MG tablet Commonly known as: ATIVAN Take 1 mg by mouth every 6 (six) hours as needed for anxiety.   multivitamin with minerals Tabs tablet Take 1 tablet by mouth daily.   rosuvastatin 10 MG tablet Commonly known as: CRESTOR Take 10 mg by mouth daily.   sertraline 100 MG tablet Commonly known as: ZOLOFT Take 100 mg by mouth daily.   simvastatin 40 MG tablet Commonly known as: ZOCOR Take 40 mg by mouth at bedtime.   spironolactone 25 MG tablet Commonly known as: ALDACTONE Take 25 mg by mouth 2 (two) times daily.   telmisartan 80 MG tablet Commonly known as: MICARDIS Take 80 mg by mouth daily.   traZODone 100 MG tablet Commonly known as: DESYREL Take 200 mg by mouth at bedtime.   trimethoprim  100 MG tablet Commonly known as: TRIMPEX Take 1 tablet (100 mg total) by mouth at bedtime.       Allergies:  Allergies  Allergen Reactions  . Iodine-131 Anaphylaxis and Itching  . Ivp Dye [Iodinated Diagnostic Agents] Anaphylaxis  . Sulfa Antibiotics Hives and Other (See Comments)    Septic?  . Sulfamethoxazole Hives and Rash  . Clams [Shellfish Allergy] Diarrhea and Nausea And Vomiting  . Phenobarbital Other (See Comments)    Tremor (intolerance)    Family History: No family history on file.  Social History:  reports that she has quit smoking. Her smoking use included cigarettes. She has a 23.50 pack-year smoking history. She has never used smokeless tobacco. She reports that she does not drink alcohol or use drugs.  ROS:                                        Physical Exam: There were no vitals taken for this visit.  Constitutional:  Alert and oriented, No acute distress. HEENT: [place] AT, moist mucus membranes.  Trachea midline, no masses. Cardiovascular: No clubbing, cyanosis, or edema. Respiratory: Normal respiratory effort, no increased work of breathing. GI: Abdomen is soft, nontender, nondistended, no abdominal masses GU: No CVA tenderness Skin: No rashes, bruises or suspicious lesions. Neurologic: Grossly intact, no focal deficits, moving all 4 extremities. Psychiatric: Normal mood and affect.  Laboratory Data: Lab Results  Component Value Date   WBC 7.3 03/11/2019   HGB 11.7 (L) 03/11/2019   HCT 35.6 (L) 03/11/2019   MCV 90.6 03/11/2019   PLT 169 03/11/2019    Lab Results  Component Value Date   CREATININE 1.02 (H) 03/11/2019    No results found for: PSA  No results found for: TESTOSTERONE  No results found for: HGBA1C  Urinalysis    Component Value Date/Time   COLORURINE YELLOW (A) 03/09/2019 0916   APPEARANCEUR Clear 05/27/2019 0959   LABSPEC 1.008 03/09/2019 0916   PHURINE 6.0 03/09/2019 0916   GLUCOSEU Negative  05/27/2019 0959   HGBUR LARGE (A) 03/09/2019 0916   BILIRUBINUR Negative 05/27/2019 [place] 03/09/2019 0916   PROTEINUR Negative 05/27/2019 0959   PROTEINUR 30 (A) 03/09/2019 0916   NITRITE Negative 05/27/2019 0959   NITRITE NEGATIVE 03/09/2019 0916   LEUKOCYTESUR Negative 05/27/2019 0959   LEUKOCYTESUR MODERATE (A) 03/09/2019 0916    Lab Results  Component Value Date   LABMICR See below: 05/27/2019   WBCUA 0-5 05/27/2019   LABEPIT None seen 05/27/2019   BACTERIA None seen 05/27/2019    Pertinent Imaging:  n/a No results found for this or any previous visit. No results found for this or any previous visit. No results found for this or any previous visit. No results found for this or any previous visit. Results for orders placed during the hospital encounter of 03/09/19  US RENAL   Narrative CLINICAL DATA:  73 year old female with a history of flank pain  EXAM: RENAL / URINARY TRACT ULTRASOUND COMPLETE  COMPARISON:  CT 02/14/2019  FINDINGS: Right Kidney:  Length: 11.1 cm x 4.6 cm x 5.1 cm, 134 cc. Echogenicity similar to that of the adjacent liver. No hydronephrosis. Cystic lesion measures 1.2 cm x 1.2 cm x 0.9 cm at the lower pole, similar to that on the prior CT.  Left Kidney:  Length: 10.1 cm x 4.5 cm x 5.0 cm, 126 cc. Echogenicity similar to that of the contralateral kidney. No hydronephrosis.  Bladder:  Decompressed urinary bladder  IMPRESSION: Sonographic survey demonstrates no evidence of hydronephrosis.  Right-sided Bosniak 1 cyst   Electronically Signed   By: Corrie Mckusick D.O.   On: 03/09/2019 15:24    No results found for this or any previous visit. No results found for this or any previous visit. Results for orders placed during the hospital encounter of 02/14/19  CT Renal Stone Study   Narrative CLINICAL DATA:  Left-sided flank pain. Fever. Nephrolithiasis. Urinary tract infection.  EXAM: CT ABDOMEN AND PELVIS WITHOUT  CONTRAST  TECHNIQUE: Multidetector CT imaging of the abdomen and pelvis was performed following the standard protocol without IV contrast.  COMPARISON:  None.  FINDINGS: Lower chest: No acute findings.  Hepatobiliary: No mass visualized on this unenhanced exam. Gallbladder is unremarkable.  Pancreas: No mass or inflammatory process visualized on this unenhanced exam.  Spleen:  Within normal limits in size.  Adrenals/Urinary tract: No evidence of urolithiasis or hydronephrosis. Mild unilateral left perinephric stranding is seen,, suspicious for pyelonephritis. Unremarkable unopacified urinary bladder.  Stomach/Bowel: No evidence of obstruction, inflammatory process, or abnormal fluid collections. Normal appendix visualized. Diverticulosis is seen mainly involving the sigmoid colon, however there is no evidence of diverticulitis.  Vascular/Lymphatic: No pathologically enlarged lymph nodes identified. No evidence of abdominal aortic aneurysm. Aortic atherosclerosis.  Reproductive: Prior hysterectomy noted. Adnexal regions are unremarkable in appearance.  Other:  None.  Musculoskeletal:  No suspicious bone lesions identified.  IMPRESSION: 1. Mild left perinephric stranding, suspicious for pyelonephritis. 2. No evidence of urolithiasis or hydronephrosis. 3. Colonic diverticulosis, without radiographic evidence of diverticulitis.  Aortic Atherosclerosis (ICD10-I70.0).   Electronically Signed   By: Earle Gell M.D.   On: 02/14/2019 09:13     Assessment & Plan:    Dysuria, frequency - wean off nightly TMP. Doing well. See in 1 year or sooner if issues.   No follow-ups on file.  Festus Aloe, MD  [facility] [address] [place], [place] [address] [phone]

## 2019-08-19 NOTE — Patient Instructions (Signed)
Urinary Tract Infection, Adult A urinary tract infection (UTI) is an infection of any part of the urinary tract. The urinary tract includes:  The kidneys.  The ureters.  The bladder.  The urethra. These organs make, store, and get rid of pee (urine) in the body. What are the causes? This is caused by germs (bacteria) in your genital area. These germs grow and cause swelling (inflammation) of your urinary tract. What increases the risk? You are more likely to develop this condition if:  You have a small, thin tube (catheter) to drain pee.  You cannot control when you pee or poop (incontinence).  You are female, and: ? You use these methods to prevent pregnancy: ? A medicine that kills sperm (spermicide). ? A device that blocks sperm (diaphragm). ? You have low levels of a female hormone (estrogen). ? You are pregnant.  You have genes that add to your risk.  You are sexually active.  You take antibiotic medicines.  You have trouble peeing because of: ? A prostate that is bigger than normal, if you are female. ? A blockage in the part of your body that drains pee from the bladder (urethra). ? A kidney stone. ? A nerve condition that affects your bladder (neurogenic bladder). ? Not getting enough to drink. ? Not peeing often enough.  You have other conditions, such as: ? Diabetes. ? A weak disease-fighting system (immune system). ? Sickle cell disease. ? Gout. ? Injury of the spine. What are the signs or symptoms? Symptoms of this condition include:  Needing to pee right away (urgently).  Peeing often.  Peeing small amounts often.  Pain or burning when peeing.  Blood in the pee.  Pee that smells bad or not like normal.  Trouble peeing.  Pee that is cloudy.  Fluid coming from the vagina, if you are female.  Pain in the belly or lower back. Other symptoms include:  Throwing up (vomiting).  No urge to eat.  Feeling mixed up (confused).  Being tired  and grouchy (irritable).  A fever.  Watery poop (diarrhea). How is this treated? This condition may be treated with:  Antibiotic medicine.  Other medicines.  Drinking enough water. Follow these instructions at home:  Medicines  Take over-the-counter and prescription medicines only as told by your doctor.  If you were prescribed an antibiotic medicine, take it as told by your doctor. Do not stop taking it even if you start to feel better. General instructions  Make sure you: ? Pee until your bladder is empty. ? Do not hold pee for a long time. ? Empty your bladder after sex. ? Wipe from front to back after pooping if you are a female. Use each tissue one time when you wipe.  Drink enough fluid to keep your pee pale yellow.  Keep all follow-up visits as told by your doctor. This is important. Contact a doctor if:  You do not get better after 1-2 days.  Your symptoms go away and then come back. Get help right away if:  You have very bad back pain.  You have very bad pain in your lower belly.  You have a fever.  You are sick to your stomach (nauseous).  You are throwing up. Summary  A urinary tract infection (UTI) is an infection of any part of the urinary tract.  This condition is caused by germs in your genital area.  There are many risk factors for a UTI. These include having a small, thin   tube to drain pee and not being able to control when you pee or poop.  Treatment includes antibiotic medicines for germs.  Drink enough fluid to keep your pee pale yellow. This information is not intended to replace advice given to you by your health care provider. Make sure you discuss any questions you have with your health care provider. Document Released: 02/06/2008 Document Revised: 08/07/2018 Document Reviewed: 02/27/2018 Elsevier Patient Education  2020 [employer]  

## 2019-09-25 ENCOUNTER — Encounter: Payer: Self-pay | Admitting: Urology

## 2019-09-25 ENCOUNTER — Other Ambulatory Visit: Payer: Self-pay

## 2019-09-25 ENCOUNTER — Ambulatory Visit: Payer: Medicare Other | Admitting: Urology

## 2019-09-25 VITALS — BP 138/87 | HR 78 | Ht 65.0 in | Wt 166.0 lb

## 2019-09-25 DIAGNOSIS — R3129 Other microscopic hematuria: Secondary | ICD-10-CM

## 2019-09-25 DIAGNOSIS — R300 Dysuria: Secondary | ICD-10-CM

## 2019-09-25 DIAGNOSIS — N390 Urinary tract infection, site not specified: Secondary | ICD-10-CM | POA: Diagnosis not present

## 2019-09-25 LAB — URINALYSIS, COMPLETE
Bilirubin, UA: NEGATIVE
Glucose, UA: NEGATIVE
Ketones, UA: NEGATIVE
Nitrite, UA: NEGATIVE
Protein,UA: NEGATIVE
Specific Gravity, UA: 1.015 (ref 1.005–1.030)
Urobilinogen, Ur: 0.2 mg/dL (ref 0.2–1.0)
pH, UA: 5.5 (ref 5.0–7.5)

## 2019-09-25 LAB — MICROSCOPIC EXAMINATION

## 2019-09-25 MED ORDER — NITROFURANTOIN MONOHYD MACRO 100 MG PO CAPS: 100.0000 mg | ORAL_CAPSULE | Freq: Two times a day (BID) | ORAL | 0 refills | Status: DC

## 2019-09-25 NOTE — Progress Notes (Signed)
09/25/2019 11:57 AM   Mary Vega [birth year] [id number]  Referring provider: Nicola Vega, [id number] [place],  [place] [address]  Chief Complaint  Patient presents with  . Dysuria    HPI: Mary Vega is a 74 year old female with rUTI's who presents today requesting an urgent appointment for possible UTI.  She is having urgency, dysuria and straining to urinate that started yesterday evening.  Patient denies any modifying or aggravating factors.  Patient denies any gross hematuria, dysuria or suprapubic/flank pain.  Patient denies any fevers, chills, nausea or vomiting.  UA yellow cloudy, 2+ blood and 1+ leukocyte on dip, microscopically 11-30 WBCs, 3-10 RBCs, 0-10 epithelial cells, 0-10 renal epithelial cells and moderate bacteria.  She had discontinued her suppressive trimethoprim last Thursday.    Renal ultrasound in July 2020 did not demonstrate hydronephrosis or nephrolithiasis.  Cystoscopy on September 2020 with Dr. Junious Silk was negative.  She states that she is drinking three 16 ounce bottles of water daily, 1 can of Pepsi and hot tea with honey.  PMH: Past Medical History:  Diagnosis Date  . Anxiety    takes Lorazepam daily  . Chronic lower back pain    buldging disc  . Constipation    takes Stool Softener daily as needed  . Depression    takes Zoloft daily  . GERD (gastroesophageal reflux disease)    takes Omeprazole daily  . History of blood transfusion    "when I had cancer surgery; hysterectomy"  . History of bronchitis    Sept 2016  . History of colon polyps    benign  . Hyperlipidemia    takes Simvastatin daily  . Hypertension    takes Losartan daily   . Insomnia    takes Trazodone nightly  . Kidney stone    "must have passed it"  . OSA on CPAP   . Ovarian cancer ([place])   . Peripheral edema    takes Spironolactone daily  . Peripheral neuropathy   . Pneumonia ~ 2012  . PONV (postoperative nausea and vomiting)   .  Sinus headache   . Urinary frequency   . UTI (lower urinary tract infection)    being treated with Macrobid,completed today 11/11/15    Surgical History: Past Surgical History:  Procedure Laterality Date  . ABDOMINAL HYSTERECTOMY    . CATARACT EXTRACTION W/ INTRAOCULAR LENS  IMPLANT, BILATERAL Bilateral   . COLONOSCOPY    . DIAGNOSTIC LAPAROSCOPY     to drain cyst on ovary  . THYROID LOBECTOMY Right 11/17/2015  . THYROID SURGERY  X 2   goiter removed  . THYROIDECTOMY Right 11/17/2015   Procedure: RIGHT THYROID LOBECTOMY WITH FROZEN SECTION;  Surgeon: Melissa Montane, MD;  Location: [facility];  Service: ENT;  Laterality: Right;    Home Medications:  Allergies as of 09/25/2019      Reactions   Iodine-131 Anaphylaxis, Itching   Ivp Dye [iodinated Diagnostic Agents] Anaphylaxis   Sulfa Antibiotics Hives, Other (See Comments)   Septic?   Sulfamethoxazole Hives, Rash   Clams [shellfish Allergy] Diarrhea, Nausea And Vomiting   Phenobarbital Other (See Comments)   Tremor (intolerance)      Medication List       Accurate as of September 25, 2019 11:57 AM. If you have any questions, ask your nurse or doctor.        amLODipine 2.5 MG tablet Commonly known as: NORVASC Take 2.5 mg by mouth daily.  famotidine 20 MG tablet Commonly known as: PEPCID Take 20 mg by mouth 2 (two) times daily.   LORazepam 1 MG tablet Commonly known as: ATIVAN Take 1 mg by mouth every 6 (six) hours as needed for anxiety.   multivitamin with minerals Tabs tablet Take 1 tablet by mouth daily.   nitrofurantoin (macrocrystal-monohydrate) 100 MG capsule Commonly known as: MACROBID Take 1 capsule (100 mg total) by mouth every 12 (twelve) hours. Started by: Mary Council, PA-C   rosuvastatin 10 MG tablet Commonly known as: CRESTOR Take 10 mg by mouth daily.   sertraline 100 MG tablet Commonly known as: ZOLOFT Take 100 mg by mouth daily.   simvastatin 40 MG tablet Commonly known as: ZOCOR Take 40 mg by  mouth at bedtime.   spironolactone 25 MG tablet Commonly known as: ALDACTONE Take 25 mg by mouth 2 (two) times daily.   telmisartan 80 MG tablet Commonly known as: MICARDIS Take 80 mg by mouth daily.   traZODone 100 MG tablet Commonly known as: DESYREL Take 200 mg by mouth at bedtime.   trimethoprim 100 MG tablet Commonly known as: TRIMPEX Take 1 tablet (100 mg total) by mouth at bedtime.       Allergies:  Allergies  Allergen Reactions  . Iodine-131 Anaphylaxis and Itching  . Ivp Dye [Iodinated Diagnostic Agents] Anaphylaxis  . Sulfa Antibiotics Hives and Other (See Comments)    Septic?  . Sulfamethoxazole Hives and Rash  . Clams [Shellfish Allergy] Diarrhea and Nausea And Vomiting  . Phenobarbital Other (See Comments)    Tremor (intolerance)    Family History: No family history on file.  Social History:  reports that she has quit smoking. Her smoking use included cigarettes. She has a 23.50 pack-year smoking history. She has never used smokeless tobacco. She reports that she does not drink alcohol or use drugs.  ROS: UROLOGY Frequent Urination?: Yes Hard to postpone urination?: Yes Burning/pain with urination?: Yes Get up at night to urinate?: No Leakage of urine?: No Urine stream starts and stops?: No Trouble starting stream?: No Do you have to strain to urinate?: No Blood in urine?: No Urinary tract infection?: No Sexually transmitted disease?: No Injury to kidneys or bladder?: No Painful intercourse?: No Weak stream?: No Currently pregnant?: No Vaginal bleeding?: No Last menstrual period?: n  Gastrointestinal Nausea?: No Vomiting?: No Indigestion/heartburn?: No Diarrhea?: No Constipation?: No  Constitutional Fever: No Night sweats?: No Weight loss?: No Fatigue?: No  Skin Skin rash/lesions?: No Itching?: No  Eyes Blurred vision?: No Double vision?: No  Ears/Nose/Throat Sore throat?: No Sinus problems?:  No  Hematologic/Lymphatic Swollen glands?: No Easy bruising?: No  Cardiovascular Leg swelling?: No Chest pain?: No  Respiratory Cough?: No Shortness of breath?: No  Endocrine Excessive thirst?: No  Musculoskeletal Back pain?: No Joint pain?: No  Neurological Headaches?: No Dizziness?: No  Psychologic Depression?: No Anxiety?: No  Physical Exam: BP 138/87   Pulse 78   Ht 5\' 5"  (1.651 m)   Wt 166 lb (75.3 kg)   BMI 27.62 kg/m  Constitutional:  Well nourished. Alert and oriented, No acute distress. HEENT: [place] AT, mask in place.  Trachea midline, no masses. Cardiovascular: No clubbing, cyanosis, or edema. Respiratory: Normal respiratory effort, no increased work of breathing. Neurologic: Grossly intact, no focal deficits, moving all 4 extremities. Psychiatric: Normal mood and affect.  Laboratory Data: Lab Results  Component Value Date   WBC 7.3 03/11/2019   HGB 11.7 (L) 03/11/2019   HCT 35.6 (L) 03/11/2019  MCV 90.6 03/11/2019   PLT 169 03/11/2019    Lab Results  Component Value Date   CREATININE 1.02 (H) 03/11/2019    No results found for: PSA  No results found for: TESTOSTERONE  No results found for: HGBA1C  Lab Results  Component Value Date   TSH 0.252 (L) 11/18/2015    No results found for: CHOL, HDL, CHOLHDL, VLDL, LDLCALC  Lab Results  Component Value Date   AST 19 03/09/2019   Lab Results  Component Value Date   ALT 30 03/09/2019   No components found for: ALKALINEPHOPHATASE No components found for: BILIRUBINTOTAL  No results found for: ESTRADIOL  Urinalysis Component     Latest Ref Rng & Units 09/25/2019  Specific Gravity, UA     1.005 - 1.030 1.015  pH, UA     5.0 - 7.5 5.5  Color, UA     Yellow Yellow  Appearance Ur     Clear Cloudy (A)  Leukocytes,UA     Negative 1+ (A)  Protein,UA     Negative/Trace Negative  Glucose, UA     Negative Negative  Ketones, UA     Negative Negative  RBC, UA     Negative 2+ (A)   Bilirubin, UA     Negative Negative  Urobilinogen, Ur     0.2 - 1.0 mg/dL 0.2  Nitrite, UA     Negative Negative  Microscopic Examination      See below:   Component     Latest Ref Rng & Units 09/25/2019  WBC, UA     0 - 5 /hpf 11-30 (A)  RBC     0 - 2 /hpf 3-10 (A)  Epithelial Cells (non renal)     0 - 10 /hpf 0-10  Renal Epithel, UA     None seen /hpf 0-10 (A)  Bacteria, UA     None seen/Few Moderate (A)    I have reviewed the labs.  Assessment & Plan:    1. Dysuria - Urinalysis, Complete-suspicious for infection, will send for culture, start nitrofurantoin empirically as she is allergic to sulfa antibiotics, will readjust if necessary once urine culture results are available  2. rUTI's Patient is drinking adequate amount of fluids, she is taking cranberry tablets and probiotics, she is not sexually active and she avoids tub baths.  She is trying to manage her bowel habits as best that she can.  We discussed adding d-mannose in order to prevent UTIs in the future.  I did explain that this was mainly based on anecdotal evidence.  We also discussed starting vaginal estrogen cream and I had offered her referral to oncology to gleen their opinion on whether or not it would be safe with her history ovarian cancer to use an estrogen cream.  I did explain that studies have shown it has been effective in reducing UTIs and explained how the postmenopausal state changes vaginal physiology.  She defers at this time.  3. Microscopic hematuria Will recheck UA in three weeks to ensure microscopic hematuria clears with treatment of infection   Return in about 3 weeks (around 10/16/2019) for UA recheck for microscopic hematuria after an UTI .  These notes generated with voice recognition software. I apologize for typographical errors.  Mary Council, PA-C  [facility] [address]  [place], [place] [address] [phone]

## 2019-09-25 NOTE — Patient Instructions (Signed)
The over the counter supplement to help stop UTI's is D-mannose.

## 2019-09-29 ENCOUNTER — Telehealth: Payer: Self-pay | Admitting: Family Medicine

## 2019-09-29 NOTE — Telephone Encounter (Signed)
Patient states she is feeling some better and wants to give it a couple more days before she decides if she needs to come in for another ua and ucx.

## 2019-09-29 NOTE — Telephone Encounter (Signed)
-----   Message from Nori Riis, PA-C sent at 09/29/2019  7:39 AM EST ----- Would you check to see if her urine went out for culture?

## 2019-09-29 NOTE — Telephone Encounter (Signed)
Would you please call Mary Vega and ask her how she is feeling?  If she is feeling well, she should finish the antibiotic and we will see her on the 11th.  If not, we will need to get another UA for culture.

## 2019-10-15 ENCOUNTER — Ambulatory Visit: Payer: Medicare Other | Admitting: Urology

## 2019-10-15 ENCOUNTER — Other Ambulatory Visit: Payer: Self-pay

## 2019-10-15 ENCOUNTER — Encounter: Payer: Self-pay | Admitting: Urology

## 2019-10-15 VITALS — BP 153/66 | HR 69 | Ht 65.0 in | Wt 168.2 lb

## 2019-10-15 DIAGNOSIS — R3129 Other microscopic hematuria: Secondary | ICD-10-CM | POA: Diagnosis not present

## 2019-10-15 DIAGNOSIS — N390 Urinary tract infection, site not specified: Secondary | ICD-10-CM

## 2019-10-15 DIAGNOSIS — R5383 Other fatigue: Secondary | ICD-10-CM | POA: Diagnosis not present

## 2019-10-15 DIAGNOSIS — N8189 Other female genital prolapse: Secondary | ICD-10-CM | POA: Diagnosis not present

## 2019-10-15 LAB — URINALYSIS, COMPLETE
Bilirubin, UA: NEGATIVE
Glucose, UA: NEGATIVE
Ketones, UA: NEGATIVE
Leukocytes,UA: NEGATIVE
Nitrite, UA: NEGATIVE
Protein,UA: NEGATIVE
RBC, UA: NEGATIVE
Specific Gravity, UA: 1.02 (ref 1.005–1.030)
Urobilinogen, Ur: 0.2 mg/dL (ref 0.2–1.0)
pH, UA: 5 (ref 5.0–7.5)

## 2019-10-15 LAB — MICROSCOPIC EXAMINATION
Bacteria, UA: NONE SEEN
RBC, Urine: NONE SEEN /hpf (ref 0–2)

## 2019-10-15 LAB — BLADDER SCAN AMB NON-IMAGING: Scan Result: 18

## 2019-10-15 NOTE — Patient Instructions (Signed)
Pelvic Organ Prolapse Pelvic organ prolapse is the stretching, bulging, or dropping of pelvic organs into an abnormal position. It happens when the muscles and tissues that surround and support pelvic structures become weak or stretched. Pelvic organ prolapse can involve the:  Vagina (vaginal prolapse).  Uterus (uterine prolapse).  Bladder (cystocele).  Rectum (rectocele).  Intestines (enterocele). When organs other than the vagina are involved, they often bulge into the vagina or protrude from the vagina, depending on how severe the prolapse is. What are the causes? This condition may be caused by:  Pregnancy, labor, and childbirth.  Past pelvic surgery.  Decreased production of the hormone estrogen associated with menopause.  Consistently lifting more than 50 lb (23 kg).  Obesity.  Long-term inability to pass stool (chronic constipation).  A cough that lasts a long time (chronic).  Buildup of fluid in the abdomen due to certain diseases and other conditions. What are the signs or symptoms? Symptoms of this condition include:  Passing a little urine (loss of bladder control) when you cough, sneeze, strain, and exercise (stress incontinence). This may be worse immediately after childbirth. It may gradually improve over time.  Feeling pressure in your pelvis or vagina. This pressure may increase when you cough or when you are passing stool.  A bulge that protrudes from the opening of your vagina.  Difficulty passing urine or stool.  Pain in your lower back.  Pain, discomfort, or disinterest in sex.  Repeated bladder infections (urinary tract infections).  Difficulty inserting a tampon. In some people, this condition causes no symptoms. How is this diagnosed? This condition may be diagnosed based on a vaginal and rectal exam. During the exam, you may be asked to cough and strain while you are lying down, sitting, and standing up. Your health care provider will  determine if other tests are required, such as bladder function tests. How is this treated? Treatment for this condition may depend on your symptoms. Treatment may include:  Lifestyle changes, such as changes to your diet.  Emptying your bladder at scheduled times (bladder training therapy). This can help reduce or avoid urinary incontinence.  Estrogen. Estrogen may help mild prolapse by increasing the strength and tone of pelvic floor muscles.  Kegel exercises. These may help mild cases of prolapse by strengthening and tightening the muscles of the pelvic floor.  A soft, flexible device that helps support the vaginal walls and keep pelvic organs in place (pessary). This is inserted into your vagina by your health care provider.  Surgery. This is often the only form of treatment for severe prolapse. Follow these instructions at home:  Avoid drinking beverages that contain caffeine or alcohol.  Increase your intake of high-fiber foods. This can help decrease constipation and straining during bowel movements.  Lose weight if recommended by your health care provider.  Wear a sanitary pad or adult diapers if you have urinary incontinence.  Avoid heavy lifting and straining with exercise and work. Do not hold your breath when you perform mild to moderate lifting and exercise activities. Limit your activities as directed by your health care provider.  Do Kegel exercises as directed by your health care provider. To do this: ? Squeeze your pelvic floor muscles tight. You should feel a tight lift in your rectal area and a tightness in your vaginal area. Keep your stomach, buttocks, and legs relaxed. ? Hold the muscles tight for up to 10 seconds. ? Relax your muscles. ? Repeat this exercise 50 times a day,   or as many times as told by your health care provider. Continue to do this exercise for at least 4-6 weeks, or for as long as told by your health care provider.  Take over-the-counter and  prescription medicines only as told by your health care provider.  If you have a pessary, take care of it as told by your health care provider.  Keep all follow-up visits as told by your health care provider. This is important. Contact a health care provider if you:  Have symptoms that interfere with your daily activities or sex life.  Need medicine to help with the discomfort.  Notice bleeding from your vagina that is not related to your period.  Have a fever.  Have pain or bleeding when you urinate.  Have bleeding when you pass stool.  Pass urine when you have sex.  Have chronic constipation.  Have a pessary that falls out.  Have bad smelling vaginal discharge.  Have an unusual, low pain in your abdomen. Summary  Pelvic organ prolapse is the stretching, bulging, or dropping of pelvic organs into an abnormal position. It happens when the muscles and tissues that surround and support pelvic structures become weak or stretched.  When organs other than the vagina are involved, they often bulge into the vagina or protrude from the vagina, depending on how severe the prolapse is.  In most cases, this condition needs to be treated only if it produces symptoms. Treatment may include lifestyle changes, estrogen, Kegel exercises, pessary insertion, or surgery.  Avoid heavy lifting and straining with exercise and work. Do not hold your breath when you perform mild to moderate lifting and exercise activities. Limit your activities as directed by your health care provider. This information is not intended to replace advice given to you by your health care provider. Make sure you discuss any questions you have with your health care provider. Document Revised: 09/11/2017 Document Reviewed: 09/11/2017 [employer] Patient Education  2020 [employer]  

## 2019-10-15 NOTE — Progress Notes (Signed)
10/15/2019 3:05 PM   Mary Vega 1946-01-13 [id number]  Referring provider: Nicola Girt, [id number] [place],  [place] [address]  Chief Complaint  Patient presents with   Recurrent UTI    HPI: Mary Vega is a 74 year old female with rUTI's who presents today for follow up after having symptoms of an UTI.    At her visit on 09/25/2019, she was experiencing urgency, dysuria and straining to urinate that started the day before her visit.  UA yellow cloudy, 2+ blood and 1+ leukocyte on dip, microscopically 11-30 WBCs, 3-10 RBCs, 0-10 epithelial cells, 0-10 renal epithelial cells and moderate bacteria.  She had discontinued her suppressive trimethoprim last Thursday.  Her urine did not get sent for culture for unknown reasons.  She was prescribed nitrofurantoin.    Today, she states that she has been feeling extremely fatigued for the last 2 weeks.  She did receive the Covid vaccine 2 weeks ago incidentally.  Patient denies any modifying or aggravating factors.  Patient denies any gross hematuria, dysuria or suprapubic/flank pain.  Patient denies any fevers, chills, nausea or vomiting.   She also mentions today that she has to push to initiate the urinary stream.  This has been occurring on an intermittent basis for the last several months.  She is also finding she needs to lean forward in order to complete the void and evacuate her bowels at times.  She does tend to suffer with constipation and uses MiraLAX for treatment.  She states at times the stool can become like thick sludge after taking the MiraLax for a time.  She has not felt a vaginal bulge, but she feels like her stool gets trapped in a "pocket."   UA is yellow cloudy, specific gravity 1.020, pH 5.0, 0-5 WBC's/hpf, 0-10 epithelial cells/hpf and hyaline casts are present.  Her PVR is 18 mL.    Renal ultrasound in July 2020 did not demonstrate hydronephrosis or nephrolithiasis.  Cystoscopy on  September 2020 with Dr. Junious Silk was negative.  Historically, she is drinking three 16 ounce bottles of water daily, 1 can of Pepsi and hot tea with honey.  PMH: Past Medical History:  Diagnosis Date   Anxiety    takes Lorazepam daily   Chronic lower back pain    buldging disc   Constipation    takes Stool Softener daily as needed   Depression    takes Zoloft daily   GERD (gastroesophageal reflux disease)    takes Omeprazole daily   History of blood transfusion    "when I had cancer surgery; hysterectomy"   History of bronchitis    Sept 2016   History of colon polyps    benign   Hyperlipidemia    takes Simvastatin daily   Hypertension    takes Losartan daily    Insomnia    takes Trazodone nightly   Kidney stone    "must have passed it"   OSA on CPAP    Ovarian cancer ([place])    Peripheral edema    takes Spironolactone daily   Peripheral neuropathy    Pneumonia ~ 2012   PONV (postoperative nausea and vomiting)    Sinus headache    Urinary frequency    UTI (lower urinary tract infection)    being treated with Macrobid,completed today 11/11/15    Surgical History: Past Surgical History:  Procedure Laterality Date   ABDOMINAL HYSTERECTOMY     CATARACT EXTRACTION W/ INTRAOCULAR LENS  IMPLANT, BILATERAL Bilateral    COLONOSCOPY     DIAGNOSTIC LAPAROSCOPY     to drain cyst on ovary   THYROID LOBECTOMY Right 11/17/2015   THYROID SURGERY  X 2   goiter removed   THYROIDECTOMY Right 11/17/2015   Procedure: RIGHT THYROID LOBECTOMY WITH FROZEN SECTION;  Surgeon: Melissa Montane, MD;  Location: [facility];  Service: ENT;  Laterality: Right;    Home Medications:  Allergies as of 10/15/2019      Reactions   Iodine-131 Anaphylaxis, Itching   Ivp Dye [iodinated Diagnostic Agents] Anaphylaxis   Sulfa Antibiotics Hives, Other (See Comments)   Septic?   Sulfamethoxazole Hives, Rash   Clams [shellfish Allergy] Diarrhea, Nausea And Vomiting   Phenobarbital  Other (See Comments)   Tremor (intolerance)      Medication List       Accurate as of October 15, 2019  3:05 PM. If you have any questions, ask your nurse or doctor.        STOP taking these medications   nitrofurantoin (macrocrystal-monohydrate) 100 MG capsule Commonly known as: MACROBID Stopped by: Cal Gindlesperger, PA-C   telmisartan 80 MG tablet Commonly known as: MICARDIS Stopped by: Sindhu Nguyen, PA-C   trimethoprim 100 MG tablet Commonly known as: TRIMPEX Stopped by: Zara Council, PA-C     TAKE these medications   amLODipine 2.5 MG tablet Commonly known as: NORVASC Take 2.5 mg by mouth daily.   famotidine 20 MG tablet Commonly known as: PEPCID Take 20 mg by mouth 2 (two) times daily.   LORazepam 1 MG tablet Commonly known as: ATIVAN Take 1 mg by mouth every 6 (six) hours as needed for anxiety.   multivitamin with minerals Tabs tablet Take 1 tablet by mouth daily.   rosuvastatin 10 MG tablet Commonly known as: CRESTOR Take 10 mg by mouth daily.   sertraline 100 MG tablet Commonly known as: ZOLOFT Take 100 mg by mouth daily.   simvastatin 40 MG tablet Commonly known as: ZOCOR Take 40 mg by mouth at bedtime.   spironolactone 25 MG tablet Commonly known as: ALDACTONE Take 25 mg by mouth 2 (two) times daily.   traZODone 100 MG tablet Commonly known as: DESYREL Take 200 mg by mouth at bedtime.       Allergies:  Allergies  Allergen Reactions   Iodine-131 Anaphylaxis and Itching   Ivp Dye [Iodinated Diagnostic Agents] Anaphylaxis   Sulfa Antibiotics Hives and Other (See Comments)    Septic?   Sulfamethoxazole Hives and Rash   Clams [Shellfish Allergy] Diarrhea and Nausea And Vomiting   Phenobarbital Other (See Comments)    Tremor (intolerance)    Family History: History reviewed. No pertinent family history.  Social History:  reports that she has quit smoking. Her smoking use included cigarettes. She has a 23.50 pack-year  smoking history. She has never used smokeless tobacco. She reports that she does not drink alcohol or use drugs.  ROS: Pertinent ROS in the HPI  Physical Exam: BP (!) 153/66    Pulse 69    Ht 5\' 5"  (1.651 m)    Wt 168 lb 3.2 oz (76.3 kg)    BMI 27.99 kg/m  Constitutional:  Well nourished. Alert and oriented, No acute distress. HEENT: [place] AT, mask in place.  Trachea midline, no masses. Cardiovascular: No clubbing, cyanosis, or edema. Respiratory: Normal respiratory effort, no increased work of breathing. GI: Abdomen is soft, non tender, non distended, no abdominal masses.  GU: No CVA tenderness.  No bladder  fullness or masses.  Atrophic external genitalia, sparse pubic hair distribution, no lesions.  Small urethral caruncle noted.  No bladder fullness, tenderness or masses. pale vagina mucosa, fair estrogen effect, no discharge, no lesions, fair pelvic support, grade II cystocele and grade I rectocele noted.  Uterus is surgically absent.  Anus and perineum are without rashes or lesions.    Skin: No rashes, bruises or suspicious lesions. Lymph: No inguinal adenopathy. Neurologic: Grossly intact, no focal deficits, moving all 4 extremities. Psychiatric: Normal mood and affect.   Laboratory Data: Lab Results  Component Value Date   WBC 7.3 03/11/2019   HGB 11.7 (L) 03/11/2019   HCT 35.6 (L) 03/11/2019   MCV 90.6 03/11/2019   PLT 169 03/11/2019    Lab Results  Component Value Date   CREATININE 1.02 (H) 03/11/2019    Lab Results  Component Value Date   TSH 0.252 (L) 11/18/2015    Lab Results  Component Value Date   AST 19 03/09/2019   Lab Results  Component Value Date   ALT 30 03/09/2019    Urinalysis Component     Latest Ref Rng & Units 10/15/2019  Specific Gravity, UA     1.005 - 1.030 1.020  pH, UA     5.0 - 7.5 5.0  Color, UA     Yellow Yellow  Appearance Ur     Clear Hazy (A)  Leukocytes,UA     Negative Negative  Protein,UA     Negative/Trace Negative    Glucose, UA     Negative Negative  Ketones, UA     Negative Negative  RBC, UA     Negative Negative  Bilirubin, UA     Negative Negative  Urobilinogen, Ur     0.2 - 1.0 mg/dL 0.2  Nitrite, UA     Negative Negative  Microscopic Examination      See below:   Component     Latest Ref Rng & Units 10/15/2019  WBC, UA     0 - 5 /hpf 0-5  RBC     0 - 2 /hpf None seen  Epithelial Cells (non renal)     0 - 10 /hpf 0-10  Casts     None seen /lpf Present (A)  Cast Type     N/A Hyaline casts  Bacteria, UA     None seen/Few None seen   I have reviewed the labs.  Pertinent Imaging Results for TONOA, VARS (MRN [id number]) as of 10/15/2019 14:49  Ref. Range 10/15/2019 13:29  Scan Result Unknown 18    Assessment & Plan:    1. Fatigue Likely due to recent COVID vaccination  Instructed to contacted PCP for further instruction  2. rUTI's No UTI symptoms at this visit, but due to fatigue will send to culture to rule out indolent infection  3. Microscopic hematuria No gross hematuria or microscopic hematuria at this time  4. Pelvic floor prolapse Explained the findings to the patient and that she did indeed have a "pocket" (rectocele) and the bladder had "fallen" and this was contributing to her urinary and rectal symptoms.   At this time she may choose to continue to handle her symptoms conservatively as she has been doing, I could refer her onto physical therapy for more evaluation and treatment and/or I could refer her to gynecology for a pessary fitting to see if this helps She would like to take time to think about this and she will keep her follow-up with Dr.  Eskridge in November   Return for Keep appointment with Dr. Junious Silk 07/15/2020 .  These notes generated with voice recognition software. I apologize for typographical errors.  Zara Council, PA-C  [facility] [address]  [place], [place] [address] [phone]

## 2019-10-18 LAB — CULTURE, URINE COMPREHENSIVE

## 2019-10-23 ENCOUNTER — Encounter: Payer: Self-pay | Admitting: Emergency Medicine

## 2019-10-23 ENCOUNTER — Ambulatory Visit
Admission: EM | Admit: 2019-10-23 | Discharge: 2019-10-23 | Disposition: A | Discharge status: Home / Self Care | Payer: Medicare Other | Attending: Family Medicine | Admitting: Family Medicine

## 2019-10-23 ENCOUNTER — Other Ambulatory Visit: Payer: Self-pay

## 2019-10-23 DIAGNOSIS — N390 Urinary tract infection, site not specified: Secondary | ICD-10-CM | POA: Insufficient documentation

## 2019-10-23 LAB — URINALYSIS, COMPLETE (UACMP) WITH MICROSCOPIC
Bilirubin Urine: NEGATIVE
Glucose, UA: NEGATIVE mg/dL
Ketones, ur: NEGATIVE mg/dL
Nitrite: NEGATIVE
Protein, ur: NEGATIVE mg/dL
RBC / HPF: >50 RBC/hpf (ref 0–5)
Specific Gravity, Urine: 1.01 (ref 1.005–1.030)
WBC, UA: >50 WBC/hpf (ref 0–5)
pH: 5 (ref 5.0–8.0)

## 2019-10-23 MED ORDER — CEPHALEXIN 500 MG PO CAPS: 500.0000 mg | ORAL_CAPSULE | Freq: Four times a day (QID) | ORAL | 0 refills | Status: AC

## 2019-10-23 NOTE — ED Provider Notes (Signed)
[facility]    CSN: [id number] Arrival date & time: 10/23/19  1456      History   Chief Complaint Chief Complaint  Patient presents with  . Dysuria    HPI Mary Vega is a 74 y.o. female.   Mary Vega presents with complaints of burning and frequency with urination which started today. She feels fatigued. Small amount of discomfort to pelvis/ over bladder. No back pain. No fevers. History of recurrent UTI, follows with urology. Last was 4 weeks ago, completed course of macrobid which did help. Has been hospitalized in the past due to pyelonephritis, per patient. She has been undergoing urological evaluation for her recurrent uti's.     ROS per HPI, negative if not otherwise mentioned.      Past Medical History:  Diagnosis Date  . Anxiety    takes Lorazepam daily  . Chronic lower back pain    buldging disc  . Constipation    takes Stool Softener daily as needed  . Depression    takes Zoloft daily  . GERD (gastroesophageal reflux disease)    takes Omeprazole daily  . History of blood transfusion    "when I had cancer surgery; hysterectomy"  . History of bronchitis    Sept 2016  . History of colon polyps    benign  . Hyperlipidemia    takes Simvastatin daily  . Hypertension    takes Losartan daily   . Insomnia    takes Trazodone nightly  . Kidney stone    "must have passed it"  . OSA on CPAP   . Ovarian cancer ([place])   . Peripheral edema    takes Spironolactone daily  . Peripheral neuropathy   . Pneumonia ~ 2012  . PONV (postoperative nausea and vomiting)   . Sinus headache   . Urinary frequency   . UTI (lower urinary tract infection)    being treated with Macrobid,completed today 11/11/15    Patient Active Problem List   Diagnosis Date Noted  . Complicated UTI (urinary tract infection) 03/09/2019  . Acute pyelonephritis 02/14/2019  . Thyroid mass 11/17/2015    Past Surgical History:  Procedure Laterality Date  . ABDOMINAL  HYSTERECTOMY    . CATARACT EXTRACTION W/ INTRAOCULAR LENS  IMPLANT, BILATERAL Bilateral   . COLONOSCOPY    . DIAGNOSTIC LAPAROSCOPY     to drain cyst on ovary  . THYROID LOBECTOMY Right 11/17/2015  . THYROID SURGERY  X 2   goiter removed  . THYROIDECTOMY Right 11/17/2015   Procedure: RIGHT THYROID LOBECTOMY WITH FROZEN SECTION;  Surgeon: Melissa Montane, MD;  Location: [facility];  Service: ENT;  Laterality: Right;    OB History   No obstetric history on file.      Home Medications    Prior to Admission medications   Medication Sig Start Date End Date Taking? Authorizing Provider  amLODipine (NORVASC) 2.5 MG tablet Take 2.5 mg by mouth daily.   Yes [provider]  famotidine (PEPCID) 20 MG tablet Take 20 mg by mouth 2 (two) times daily.   Yes [provider]  LORazepam (ATIVAN) 1 MG tablet Take 1 mg by mouth every 6 (six) hours as needed for anxiety.   Yes [provider]  Multiple Vitamin (MULTIVITAMIN WITH MINERALS) TABS tablet Take 1 tablet by mouth daily.   Yes [provider]  rosuvastatin (CRESTOR) 10 MG tablet Take 10 mg by mouth daily. 02/20/19  Yes [provider]  sertraline (ZOLOFT)  100 MG tablet Take 100 mg by mouth daily.   Yes [provider]  simvastatin (ZOCOR) 40 MG tablet Take 40 mg by mouth at bedtime.    Yes [provider]  spironolactone (ALDACTONE) 25 MG tablet Take 25 mg by mouth 2 (two) times daily.    Yes [provider]  traZODone (DESYREL) 100 MG tablet Take 200 mg by mouth at bedtime.   Yes [provider]  cephALEXin (KEFLEX) 500 MG capsule Take 1 capsule (500 mg total) by mouth 4 (four) times daily for 7 days. 10/23/19 10/30/19  Zigmund Gottron, NP    Family History History reviewed. No pertinent family history.  Social History Social History   Tobacco Use  . Smoking status: Former Smoker    Packs/day: 0.50    Years: 47.00    Pack years: 23.50    Types: Cigarettes  .  Smokeless tobacco: Never Used  . Tobacco comment: quit smoking in ~ 2012  Substance Use Topics  . Alcohol use: No  . Drug use: No     Allergies   Iodine-131, Ivp dye [iodinated diagnostic agents], Sulfa antibiotics, Sulfamethoxazole, Clams [shellfish allergy], and Phenobarbital   Review of Systems Review of Systems   Physical Exam Triage Vital Signs ED Triage Vitals  Enc Vitals Group     BP 10/23/19 1529 (!) 146/85     Pulse Rate 10/23/19 1529 61     Resp 10/23/19 1529 16     Temp 10/23/19 1529 98.2 F (36.8 C)     Temp Source 10/23/19 1529 Oral     SpO2 10/23/19 1529 99 %     Weight 10/23/19 1527 166 lb (75.3 kg)     Height 10/23/19 1527 5\' 5"  (1.651 m)     Head Circumference --      Peak Flow --      Pain Score 10/23/19 1526 8     Pain Loc --      Pain Edu? --      Excl. in [place]? --    No data found.  Updated Vital Signs BP (!) 146/85 (BP Location: Left Arm)   Pulse 61   Temp 98.2 F (36.8 C) (Oral)   Resp 16   Ht 5\' 5"  (1.651 m)   Wt 166 lb (75.3 kg)   SpO2 99%   BMI 27.62 kg/m    Physical Exam Constitutional:      General: She is not in acute distress.    Appearance: She is well-developed.  Cardiovascular:     Rate and Rhythm: Normal rate.  Pulmonary:     Effort: Pulmonary effort is normal.  Abdominal:     Tenderness: There is no right CVA tenderness or left CVA tenderness.  Skin:    General: Skin is warm and dry.  Neurological:     Mental Status: She is alert and oriented to person, place, and time.      UC Treatments / Results  Labs (all labs ordered are listed, but only abnormal results are displayed) Labs Reviewed  URINALYSIS, COMPLETE (UACMP) WITH MICROSCOPIC - Abnormal; Notable for the following components:      Result Value   APPearance HAZY (*)    Hgb urine dipstick LARGE (*)    Leukocytes,Ua SMALL (*)    Bacteria, UA FEW (*)    All other components within normal limits  URINE CULTURE    EKG   Radiology No results  found.  Procedures Procedures (including critical care time)  Medications Ordered in UC Medications - No data to display  Initial Impression / Assessment and Plan / UC Course  I have reviewed the triage vital signs and the nursing notes.  Pertinent labs & imaging results that were available during my care of the patient were reviewed by me and considered in my medical decision making (see chart for details).     Urine with hgb, leuks and few bacteria. Symptoms started today. Recurrent uti's. Culture reviewed from 05/19/2019 and started keflex today as was just recently on macrobid. Repeat culture obtained today. Encouraged continued follow up with urology. Return precautions provided. Patient verbalized understanding and agreeable to plan.   Final Clinical Impressions(s) / UC Diagnoses   Final diagnoses:  Recurrent UTI     Discharge Instructions     Your urine does appear consistent with uti so we will start antibiotics.  I have sent it to be cultured to confirm appropriate antibiotic.  I do recommend follow up with urology for continued management of your recurrent uti's.  Continue to drink plenty of water to empty your bladder regularly.  Please return or go to the ER for any worsening of symptoms.    ED Prescriptions    Medication Sig Dispense Auth. Provider   cephALEXin (KEFLEX) 500 MG capsule Take 1 capsule (500 mg total) by mouth 4 (four) times daily for 7 days. 28 capsule Zigmund Gottron, NP     PDMP not reviewed this encounter.   Zigmund Gottron, NP 10/23/19 1646

## 2019-10-23 NOTE — Discharge Instructions (Signed)
Your urine does appear consistent with uti so we will start antibiotics.  I have sent it to be cultured to confirm appropriate antibiotic.  I do recommend follow up with urology for continued management of your recurrent uti's.  Continue to drink plenty of water to empty your bladder regularly.  Please return or go to the ER for any worsening of symptoms.

## 2019-10-23 NOTE — ED Triage Notes (Signed)
Patient c/o burning when urinating and urinary frequency that started yesterday.

## 2019-10-25 LAB — URINE CULTURE

## 2020-06-06 DIAGNOSIS — M5416 Radiculopathy, lumbar region: Secondary | ICD-10-CM | POA: Insufficient documentation

## 2020-06-06 DIAGNOSIS — M533 Sacrococcygeal disorders, not elsewhere classified: Secondary | ICD-10-CM | POA: Insufficient documentation

## 2020-06-28 DIAGNOSIS — E213 Hyperparathyroidism, unspecified: Secondary | ICD-10-CM | POA: Insufficient documentation

## 2020-06-28 DIAGNOSIS — G4733 Obstructive sleep apnea (adult) (pediatric): Secondary | ICD-10-CM | POA: Insufficient documentation

## 2020-07-15 ENCOUNTER — Encounter: Payer: Self-pay | Admitting: Urology

## 2020-07-15 ENCOUNTER — Other Ambulatory Visit: Payer: Self-pay

## 2020-07-15 ENCOUNTER — Ambulatory Visit: Payer: Medicare Other | Admitting: Urology

## 2020-07-15 VITALS — BP 126/78 | HR 65 | Ht 65.0 in | Wt 178.0 lb

## 2020-07-15 DIAGNOSIS — N281 Cyst of kidney, acquired: Secondary | ICD-10-CM

## 2020-07-15 DIAGNOSIS — N390 Urinary tract infection, site not specified: Secondary | ICD-10-CM | POA: Diagnosis not present

## 2020-07-15 DIAGNOSIS — R3129 Other microscopic hematuria: Secondary | ICD-10-CM

## 2020-07-15 LAB — URINALYSIS, COMPLETE
Bilirubin, UA: NEGATIVE
Glucose, UA: NEGATIVE
Ketones, UA: NEGATIVE
Nitrite, UA: NEGATIVE
Protein,UA: NEGATIVE
Specific Gravity, UA: 1.015 (ref 1.005–1.030)
Urobilinogen, Ur: 0.2 mg/dL (ref 0.2–1.0)
pH, UA: 5.5 (ref 5.0–7.5)

## 2020-07-15 LAB — MICROSCOPIC EXAMINATION: Bacteria, UA: NONE SEEN

## 2020-07-15 NOTE — Progress Notes (Signed)
07/15/2020 10:38 AM   Mary Vega [birth year] [id number]  Referring provider: Nicola Girt, DO [address] [place],  [place] [address]  No chief complaint on file.   HPI:  F/u -   1) Recurrent urinary tract --  August 2020 - dysuria, intermittent stream. PVR 79 ml. No NG risk. Urine cultures grew a resistant E. coli. 6/2020CT scan revealed stranding around the left kidney consistent with pyelonephritis. Follow-up renal ultrasound 7/2020was benign. Bladder not distended. She had significant constipation. Normal kidney function. Post void 79 mL. She had complete hx for Ov Ca in 2004. She had chemo.She tried nightly NF. She increased water, decreased soda, took probiotics and cranberry supplements. Cystoscopy/exam benign 05/2019.   2) right renal cyst-she had a small 1.2 cm right renal cyst on CT and KUB in 2020.  Her dad had PCKD.  He lived to be 52 years old.  3) LUTS- Sometimes interrupted stream or straining to void. She has issues with constipation and manageable. No gross hematuria. She took nightly TMP for a time. Seen Feb 2021 with straining to void, pvr 18, cx mixed, grade II cystocele and grade I rectocele on exam with [facility]. She leaks a small amount with a cough or sneeze but no pad use.    Today, she is well. No abx. No dysuria.  Voiding adequately.  PMH: Past Medical History:  Diagnosis Date  . Anxiety    takes Lorazepam daily  . Chronic lower back pain    buldging disc  . Constipation    takes Stool Softener daily as needed  . Depression    takes Zoloft daily  . GERD (gastroesophageal reflux disease)    takes Omeprazole daily  . History of blood transfusion    "when I had cancer surgery; hysterectomy"  . History of bronchitis    Sept 2016  . History of colon polyps    benign  . Hyperlipidemia    takes Simvastatin daily  . Hypertension    takes Losartan daily   . Insomnia    takes Trazodone nightly  . Kidney stone     "must have passed it"  . OSA on CPAP   . Ovarian cancer ([place])   . Peripheral edema    takes Spironolactone daily  . Peripheral neuropathy   . Pneumonia ~ 2012  . PONV (postoperative nausea and vomiting)   . Sinus headache   . Urinary frequency   . UTI (lower urinary tract infection)    being treated with Macrobid,completed today 11/11/15    Surgical History: Past Surgical History:  Procedure Laterality Date  . ABDOMINAL HYSTERECTOMY    . CATARACT EXTRACTION W/ INTRAOCULAR LENS  IMPLANT, BILATERAL Bilateral   . COLONOSCOPY    . DIAGNOSTIC LAPAROSCOPY     to drain cyst on ovary  . THYROID LOBECTOMY Right 11/17/2015  . THYROID SURGERY  X 2   goiter removed  . THYROIDECTOMY Right 11/17/2015   Procedure: RIGHT THYROID LOBECTOMY WITH FROZEN SECTION;  Surgeon: Melissa Montane, MD;  Location: [facility];  Service: ENT;  Laterality: Right;    Home Medications:  Allergies as of 07/15/2020      Reactions   Iodine-131 Anaphylaxis, Itching   Ivp Dye [iodinated Diagnostic Agents] Anaphylaxis   Sulfa Antibiotics Hives, Other (See Comments)   Septic?   Sulfamethoxazole Hives, Rash   Clams [shellfish Allergy] Diarrhea, Nausea And Vomiting   Phenobarbital Other (See Comments)   Tremor (intolerance)  Medication List       Accurate as of July 15, 2020 10:38 AM. If you have any questions, ask your nurse or doctor.        amLODipine 2.5 MG tablet Commonly known as: NORVASC Take 2.5 mg by mouth daily.   famotidine 20 MG tablet Commonly known as: PEPCID Take 20 mg by mouth 2 (two) times daily.   LORazepam 1 MG tablet Commonly known as: ATIVAN Take 1 mg by mouth every 6 (six) hours as needed for anxiety.   multivitamin with minerals Tabs tablet Take 1 tablet by mouth daily.   rosuvastatin 10 MG tablet Commonly known as: CRESTOR Take 10 mg by mouth daily.   sertraline 100 MG tablet Commonly known as: ZOLOFT Take 100 mg by mouth daily.   simvastatin 40 MG tablet Commonly  known as: ZOCOR Take 40 mg by mouth at bedtime.   spironolactone 25 MG tablet Commonly known as: ALDACTONE Take 25 mg by mouth 2 (two) times daily.   traZODone 100 MG tablet Commonly known as: DESYREL Take 200 mg by mouth at bedtime.       Allergies:  Allergies  Allergen Reactions  . Iodine-131 Anaphylaxis and Itching  . Ivp Dye [Iodinated Diagnostic Agents] Anaphylaxis  . Sulfa Antibiotics Hives and Other (See Comments)    Septic?  . Sulfamethoxazole Hives and Rash  . Clams [Shellfish Allergy] Diarrhea and Nausea And Vomiting  . Phenobarbital Other (See Comments)    Tremor (intolerance)    Family History: No family history on file.  Social History:  reports that she has quit smoking. Her smoking use included cigarettes. She has a 23.50 pack-year smoking history. She has never used smokeless tobacco. She reports that she does not drink alcohol and does not use drugs.   Physical Exam: There were no vitals taken for this visit.  Constitutional:  Alert and oriented, No acute distress. HEENT: [place] AT, moist mucus membranes.  Trachea midline, no masses. Cardiovascular: No clubbing, cyanosis, or edema. Respiratory: Normal respiratory effort, no increased work of breathing. GI: Abdomen is soft, nontender, nondistended, no abdominal masses GU: No CVA tenderness Skin: No rashes, bruises or suspicious lesions. Neurologic: Grossly intact, no focal deficits, moving all 4 extremities. Psychiatric: Normal mood and affect.  Laboratory Data: Lab Results  Component Value Date   WBC 7.3 03/11/2019   HGB 11.7 (L) 03/11/2019   HCT 35.6 (L) 03/11/2019   MCV 90.6 03/11/2019   PLT 169 03/11/2019    Lab Results  Component Value Date   CREATININE 1.02 (H) 03/11/2019    No results found for: PSA  No results found for: TESTOSTERONE  No results found for: HGBA1C  Urinalysis    Component Value Date/Time   COLORURINE YELLOW 10/23/2019 1529   APPEARANCEUR HAZY (A) 10/23/2019 1529    APPEARANCEUR Hazy (A) 10/15/2019 1307   LABSPEC 1.010 10/23/2019 1529   PHURINE 5.0 10/23/2019 1529   GLUCOSEU NEGATIVE 10/23/2019 1529   HGBUR LARGE (A) 10/23/2019 1529   BILIRUBINUR NEGATIVE 10/23/2019 1529   BILIRUBINUR Negative 10/15/2019 1307   KETONESUR NEGATIVE 10/23/2019 1529   PROTEINUR NEGATIVE 10/23/2019 1529   NITRITE NEGATIVE 10/23/2019 1529   LEUKOCYTESUR SMALL (A) 10/23/2019 1529    Lab Results  Component Value Date   LABMICR See below: 10/15/2019   WBCUA 0-5 10/15/2019   LABEPIT 0-10 10/15/2019   BACTERIA FEW (A) 10/23/2019    Pertinent Imaging: Renal ultrasound and CT-2020 No results found for this or any previous visit.  No  results found for this or any previous visit.  No results found for this or any previous visit.  No results found for this or any previous visit.  Results for orders placed during the hospital encounter of 03/09/19  US RENAL  Narrative CLINICAL DATA:  74 year old female with a history of flank pain  EXAM: RENAL / URINARY TRACT ULTRASOUND COMPLETE  COMPARISON:  CT 02/14/2019  FINDINGS: Right Kidney:  Length: 11.1 cm x 4.6 cm x 5.1 cm, 134 cc. Echogenicity similar to that of the adjacent liver. No hydronephrosis. Cystic lesion measures 1.2 cm x 1.2 cm x 0.9 cm at the lower pole, similar to that on the prior CT.  Left Kidney:  Length: 10.1 cm x 4.5 cm x 5.0 cm, 126 cc. Echogenicity similar to that of the contralateral kidney. No hydronephrosis.  Bladder:  Decompressed urinary bladder  IMPRESSION: Sonographic survey demonstrates no evidence of hydronephrosis.  Right-sided Bosniak 1 cyst   Electronically Signed By: Corrie Mckusick D.O. On: 03/09/2019 15:24  No results found for this or any previous visit.  No results found for this or any previous visit.  Results for orders placed during the hospital encounter of 02/14/19  CT Renal Stone Study  Narrative CLINICAL DATA:  Left-sided flank pain. Fever.  Nephrolithiasis. Urinary tract infection.  EXAM: CT ABDOMEN AND PELVIS WITHOUT CONTRAST  TECHNIQUE: Multidetector CT imaging of the abdomen and pelvis was performed following the standard protocol without IV contrast.  COMPARISON:  None.  FINDINGS: Lower chest: No acute findings.  Hepatobiliary: No mass visualized on this unenhanced exam. Gallbladder is unremarkable.  Pancreas: No mass or inflammatory process visualized on this unenhanced exam.  Spleen:  Within normal limits in size.  Adrenals/Urinary tract: No evidence of urolithiasis or hydronephrosis. Mild unilateral left perinephric stranding is seen,, suspicious for pyelonephritis. Unremarkable unopacified urinary bladder.  Stomach/Bowel: No evidence of obstruction, inflammatory process, or abnormal fluid collections. Normal appendix visualized. Diverticulosis is seen mainly involving the sigmoid colon, however there is no evidence of diverticulitis.  Vascular/Lymphatic: No pathologically enlarged lymph nodes identified. No evidence of abdominal aortic aneurysm. Aortic atherosclerosis.  Reproductive: Prior hysterectomy noted. Adnexal regions are unremarkable in appearance.  Other:  None.  Musculoskeletal:  No suspicious bone lesions identified.  IMPRESSION: 1. Mild left perinephric stranding, suspicious for pyelonephritis. 2. No evidence of urolithiasis or hydronephrosis. 3. Colonic diverticulosis, without radiographic evidence of diverticulitis.  Aortic Atherosclerosis (ICD10-I70.0).   Electronically Signed By: Earle Gell M.D. On: 02/14/2019 09:13   Assessment & Plan:    Frequency - UTI - stable.  No bothersome symptoms of stress incontinence or prolapse.  Discussed PT but she will continue to monitor.  Renal cyst - check renal US in 1 year   No follow-ups on file.  Festus Aloe, MD  [facility] [address] [place], [place] [address] [phone]

## 2020-08-30 ENCOUNTER — Encounter: Payer: Self-pay | Admitting: Urology

## 2020-08-30 ENCOUNTER — Other Ambulatory Visit
Admission: RE | Admit: 2020-08-30 | Discharge: 2020-08-30 | Disposition: A | Discharge status: Home / Self Care | Payer: Medicare Other | Attending: Urology | Admitting: Urology

## 2020-08-30 ENCOUNTER — Other Ambulatory Visit: Payer: Self-pay

## 2020-08-30 ENCOUNTER — Ambulatory Visit: Payer: Medicare Other | Admitting: Urology

## 2020-08-30 VITALS — BP 148/82 | HR 68 | Ht 65.0 in | Wt 178.0 lb

## 2020-08-30 DIAGNOSIS — N390 Urinary tract infection, site not specified: Secondary | ICD-10-CM

## 2020-08-30 DIAGNOSIS — N3000 Acute cystitis without hematuria: Secondary | ICD-10-CM | POA: Diagnosis not present

## 2020-08-30 DIAGNOSIS — F32A Depression, unspecified: Secondary | ICD-10-CM | POA: Insufficient documentation

## 2020-08-30 LAB — URINALYSIS, COMPLETE (UACMP) WITH MICROSCOPIC
Bilirubin Urine: NEGATIVE
Glucose, UA: NEGATIVE mg/dL
Ketones, ur: NEGATIVE mg/dL
Nitrite: POSITIVE — AB
Specific Gravity, Urine: 1.01 (ref 1.005–1.030)
WBC, UA: >50 WBC/hpf (ref 0–5)
pH: 5.5 (ref 5.0–8.0)

## 2020-08-30 MED ORDER — CEPHALEXIN 500 MG PO CAPS: 500.0000 mg | ORAL_CAPSULE | Freq: Two times a day (BID) | ORAL | 0 refills | Status: AC

## 2020-08-30 NOTE — Progress Notes (Signed)
   08/30/2020 2:06 PM   Mary Vega 25-Jan-1946 [id number]  Reason for visit: Acute UTI, history of pyelonephritis  HPI: I saw Mary Vega today as an add-on in clinic for acute UTI.  She is a 74 year old female with a history of pyelonephritis who presents with 24 hours of worsening urinary symptoms of dysuria, urgency, frequency, and pelvic discomfort.  She denies any fevers, chills, or flank pain.  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  Urinalysis today consistent with infection with 0-5 squamous cells, many bacteria, greater than 50 WBCs, 6-10 RBCs, large leukocytes, nitrite positive.  Will send for culture.  I personally reviewed her CT from June 2020 that shows no evidence of stone disease or hydronephrosis, she had perinephric stranding at that time consistent with left-sided pyelonephritis  She takes cranberry tablets for UTI prophylaxis currently.  She has a history of ovarian cancer and is understandably averse to considering topical estrogen cream.  Suspect acute UTI as etiology of her symptoms.  Based on her prior resistant E. coli, I recommended a 5-day course of Keflex.  Return precautions discussed at length  Keflex 500 mg twice daily x5 days Return precautions discussed RTC 1 year with PA  Sondra Come, MD  [facility] [address], [address] [place], [place] [address] [phone]

## 2020-09-02 LAB — URINE CULTURE: Culture: >=100000 — AB

## 2020-10-07 ENCOUNTER — Encounter: Payer: Self-pay | Admitting: Physician Assistant

## 2020-10-07 ENCOUNTER — Ambulatory Visit: Payer: Medicare Other | Admitting: Physician Assistant

## 2020-10-07 ENCOUNTER — Other Ambulatory Visit: Payer: Self-pay

## 2020-10-07 VITALS — BP 166/92 | HR 79 | Temp 98.1°F | Ht 65.0 in | Wt 179.0 lb

## 2020-10-07 DIAGNOSIS — N390 Urinary tract infection, site not specified: Secondary | ICD-10-CM | POA: Diagnosis not present

## 2020-10-07 LAB — URINALYSIS, COMPLETE
Bilirubin, UA: NEGATIVE
Ketones, UA: NEGATIVE
Nitrite, UA: NEGATIVE
Specific Gravity, UA: 1.01 (ref 1.005–1.030)
Urobilinogen, Ur: 0.2 mg/dL (ref 0.2–1.0)
pH, UA: 5.5 (ref 5.0–7.5)

## 2020-10-07 LAB — MICROSCOPIC EXAMINATION
Bacteria, UA: NONE SEEN
WBC, UA: >30 /hpf — AB (ref 0–5)

## 2020-10-07 MED ORDER — CEPHALEXIN 500 MG PO CAPS: 500.0000 mg | ORAL_CAPSULE | Freq: Two times a day (BID) | ORAL | 0 refills | Status: AC

## 2020-10-07 NOTE — Progress Notes (Signed)
10/07/2020 12:47 PM   Mary Vega 1946/01/21 [id number]  CC: Chief Complaint  Patient presents with  . Urinary Tract Infection    HPI: Mary Vega is a 75 y.o. female with PMH recurrent UTI, pyelonephritis, and ovarian cancer who previously declined topical vaginal estrogen who presents today for evaluation of possible UTI.   Today she reports a 1 day history of dysuria, urgency, frequency, lower abdominal pain, and gross hematuria.  She denies fever, chills, nausea, vomiting, and flank pain.  She underwent CT stone study on 02/14/2019 without evidence of urolithiasis.  In-office UA today positive for trace glucose, 3+ blood, 2+ protein, and 2+ leukocyte esterase; urine microscopy with >30 WBCs/HPF and 11-30 RBCs/HPF.  PMH: Past Medical History:  Diagnosis Date  . Anxiety    takes Lorazepam daily  . Chronic lower back pain    buldging disc  . Constipation    takes Stool Softener daily as needed  . Depression    takes Zoloft daily  . GERD (gastroesophageal reflux disease)    takes Omeprazole daily  . History of blood transfusion    "when I had cancer surgery; hysterectomy"  . History of bronchitis    Sept 2016  . History of colon polyps    benign  . Hyperlipidemia    takes Simvastatin daily  . Hypertension    takes Losartan daily   . Insomnia    takes Trazodone nightly  . Kidney stone    "must have passed it"  . OSA on CPAP   . Ovarian cancer ([place])   . Peripheral edema    takes Spironolactone daily  . Peripheral neuropathy   . Pneumonia ~ 2012  . PONV (postoperative nausea and vomiting)   . Sinus headache   . Urinary frequency   . UTI (lower urinary tract infection)    being treated with Macrobid,completed today 11/11/15    Surgical History: Past Surgical History:  Procedure Laterality Date  . ABDOMINAL HYSTERECTOMY    . CATARACT EXTRACTION W/ INTRAOCULAR LENS  IMPLANT, BILATERAL Bilateral   . COLONOSCOPY    . DIAGNOSTIC LAPAROSCOPY     to  drain cyst on ovary  . THYROID LOBECTOMY Right 11/17/2015  . THYROID SURGERY  X 2   goiter removed  . THYROIDECTOMY Right 11/17/2015   Procedure: RIGHT THYROID LOBECTOMY WITH FROZEN SECTION;  Surgeon: Melissa Montane, MD;  Location: [facility];  Service: ENT;  Laterality: Right;    Home Medications:  Allergies as of 10/07/2020      Reactions   Iodine-131 Anaphylaxis, Itching   Ivp Dye [iodinated Diagnostic Agents] Anaphylaxis   Sulfa Antibiotics Hives, Other (See Comments)   Septic?   Sulfamethoxazole Hives, Rash   Clams [shellfish Allergy] Diarrhea, Nausea And Vomiting   Phenobarbital Other (See Comments)   Tremor (intolerance)      Medication List       Accurate as of October 07, 2020 12:47 PM. If you have any questions, ask your nurse or doctor.        amLODipine 2.5 MG tablet Commonly known as: NORVASC Take 2.5 mg by mouth daily.   famotidine 20 MG tablet Commonly known as: PEPCID Take 20 mg by mouth 2 (two) times daily.   ipratropium 0.06 % nasal spray Commonly known as: ATROVENT Place 2 sprays into both nostrils 4 (four) times daily.   LORazepam 1 MG tablet Commonly known as: ATIVAN Take 1 mg by mouth every 6 (six) hours as needed for anxiety.  multivitamin with minerals Tabs tablet Take 1 tablet by mouth daily.   rosuvastatin 10 MG tablet Commonly known as: CRESTOR Take 10 mg by mouth daily.   sertraline 100 MG tablet Commonly known as: ZOLOFT Take 100 mg by mouth daily.   simvastatin 40 MG tablet Commonly known as: ZOCOR Take 40 mg by mouth at bedtime.   spironolactone 25 MG tablet Commonly known as: ALDACTONE Take 25 mg by mouth 2 (two) times daily.   traZODone 100 MG tablet Commonly known as: DESYREL Take 200 mg by mouth at bedtime.       Allergies:  Allergies  Allergen Reactions  . Iodine-131 Anaphylaxis and Itching  . Ivp Dye [Iodinated Diagnostic Agents] Anaphylaxis  . Sulfa Antibiotics Hives and Other (See Comments)    Septic?  .  Sulfamethoxazole Hives and Rash  . Clams [Shellfish Allergy] Diarrhea and Nausea And Vomiting  . Phenobarbital Other (See Comments)    Tremor (intolerance)    Family History: No family history on file.  Social History:   reports that she has quit smoking. Her smoking use included cigarettes. She has a 23.50 pack-year smoking history. She has never used smokeless tobacco. She reports that she does not drink alcohol and does not use drugs.  Physical Exam: BP (!) 166/92   Pulse 79   Temp 98.1 F (36.7 C) (Oral)   Ht 5\' 5"  (1.651 m)   Wt 179 lb (81.2 kg)   BMI 29.79 kg/m   Constitutional:  Alert and oriented, no acute distress, nontoxic appearing HEENT: [place], AT Cardiovascular: No clubbing, cyanosis, or edema Respiratory: Normal respiratory effort, no increased work of breathing GU: No CVA tenderness Skin: No rashes, bruises or suspicious lesions Neurologic: Grossly intact, no focal deficits, moving all 4 extremities Psychiatric: Normal mood and affect  Laboratory Data: Results for orders placed or performed in visit on 10/07/20  CULTURE, URINE COMPREHENSIVE   Specimen: Urine   UR  Result Value Ref Range   Urine Culture, Comprehensive Preliminary report    Organism ID, Bacteria Comment   Microscopic Examination   Urine  Result Value Ref Range   WBC, UA >30 (A) 0 - 5 /hpf   RBC 11-30 (A) 0 - 2 /hpf   Epithelial Cells (non renal) 0-10 0 - 10 /hpf   Bacteria, UA None seen None seen/Few  Urinalysis, Complete  Result Value Ref Range   Specific Gravity, UA 1.010 1.005 - 1.030   pH, UA 5.5 5.0 - 7.5   Color, UA Red (A) Yellow   Appearance Ur Cloudy (A) Clear   Leukocytes,UA 2+ (A) Negative   Protein,UA 2+ (A) Negative/Trace   Glucose, UA Trace (A) Negative   Ketones, UA Negative Negative   RBC, UA 3+ (A) Negative   Bilirubin, UA Negative Negative   Urobilinogen, Ur 0.2 0.2 - 1.0 mg/dL   Nitrite, UA Negative Negative   Microscopic Examination See below:    Assessment &  Plan:   1. Recurrent UTI UA infected today, will start empiric Keflex and send for culture for further evaluation.  Low concern for renal involvement.  She will require lab visit with UA in 1 week to prove resolution of MH.  Patient is in agreement with this plan. - Urinalysis, Complete - CULTURE, URINE COMPREHENSIVE - cephALEXin (KEFLEX) 500 MG capsule; Take 1 capsule (500 mg total) by mouth 2 (two) times daily for 7 days.  Dispense: 14 capsule; Refill: 0   Return in about 1 week (around 10/14/2020) for Lab  visit for UA.  Debroah Loop, PA-C  [facility] [address], [place], [place] [address] [phone]

## 2020-10-11 LAB — CULTURE, URINE COMPREHENSIVE

## 2020-10-13 ENCOUNTER — Telehealth: Payer: Self-pay

## 2020-10-13 NOTE — Telephone Encounter (Signed)
-----   Message from Debroah Loop, [place] sent at 10/13/2020 10:54 AM EST ----- Please contact her and ask if she is feeling any better on Keflex. If so, I'd like to repeat her UA as scheduled to prove resolution of MH, as it is possible this was a falsely negative urine culture. If she does not feel better, recommend CTU for further evaluation of her symptoms. ----- Message ----- From: Lavone Neri Lab Results In Sent: 10/07/2020   4:37 PM EST To: Debroah Loop, PA-C

## 2020-10-13 NOTE — Telephone Encounter (Signed)
LMOM for patient to return call.

## 2020-10-17 ENCOUNTER — Other Ambulatory Visit: Payer: Self-pay

## 2020-10-17 ENCOUNTER — Other Ambulatory Visit: Payer: Medicare Other

## 2020-10-17 DIAGNOSIS — N390 Urinary tract infection, site not specified: Secondary | ICD-10-CM

## 2020-10-18 LAB — URINALYSIS, COMPLETE
Bilirubin, UA: NEGATIVE
Glucose, UA: NEGATIVE
Ketones, UA: NEGATIVE
Nitrite, UA: NEGATIVE
Protein,UA: NEGATIVE
Specific Gravity, UA: 1.02 (ref 1.005–1.030)
Urobilinogen, Ur: 0.2 mg/dL (ref 0.2–1.0)
pH, UA: 5.5 (ref 5.0–7.5)

## 2020-10-18 LAB — MICROSCOPIC EXAMINATION

## 2020-10-20 ENCOUNTER — Telehealth: Payer: Self-pay

## 2020-10-20 DIAGNOSIS — N390 Urinary tract infection, site not specified: Secondary | ICD-10-CM

## 2020-10-20 NOTE — Telephone Encounter (Signed)
-----   Message from Debroah Loop, [place] sent at 10/20/2020  8:51 AM EST ----- Her MH has resolved. Please confirm if she's feeling better in light of her negative urine culture. If not, would recommend CT stone study for further evaluation. If feeling better, no further intervention indicated. ----- Message ----- From: Lavone Neri Lab Results In Sent: 10/18/2020   5:38 AM EST To: Debroah Loop, PA-C

## 2020-10-20 NOTE — Telephone Encounter (Signed)
Patient reports overall she is feeling better, however she is still having some bothersome urinary symptoms. Patient states at times (not all the time) she has urgency but cannot urinate whenever she has the urgency feeling. She also says she has pain in her bladder in the morning that is relieved with urinating. Patient would like to proceed with CT stone study as she says she still believes something isnt right. She is also seeing endocrinology at [facility] who is obtaining a 24 hr urine study.

## 2020-11-10 NOTE — Telephone Encounter (Signed)
I spoke with Dr. Diamantina Providence, who recommends renal ultrasound for further evaluation of her symptoms.  I placed an order for this today.  Please contact the patient and inform her that the imaging department will contact her to schedule this.  I will contact her with her results.  If her results are negative as she remains symptomatic with irritative voiding symptoms, okay to pursue cystoscopy.

## 2020-11-10 NOTE — Telephone Encounter (Signed)
Notified patient as advised, patient expressed understanding.  

## 2020-11-10 NOTE — Addendum Note (Signed)
Addended by: Mickle Plumb on: 11/10/2020 04:27 PM   Modules accepted: Orders

## 2021-06-26 ENCOUNTER — Other Ambulatory Visit: Payer: Self-pay

## 2021-06-26 ENCOUNTER — Ambulatory Visit
Admission: RE | Admit: 2021-06-26 | Discharge: 2021-06-26 | Disposition: A | Discharge status: Home / Self Care | Payer: Medicare Other | Source: Ambulatory Visit | Attending: Urology | Admitting: Urology

## 2021-06-26 DIAGNOSIS — N281 Cyst of kidney, acquired: Secondary | ICD-10-CM | POA: Insufficient documentation

## 2021-07-14 ENCOUNTER — Encounter: Payer: Self-pay | Admitting: Physician Assistant

## 2021-07-14 ENCOUNTER — Ambulatory Visit: Payer: Self-pay | Admitting: Physician Assistant

## 2021-07-19 ENCOUNTER — Encounter: Payer: Self-pay | Admitting: Physician Assistant

## 2021-07-19 ENCOUNTER — Other Ambulatory Visit: Payer: Self-pay

## 2021-07-19 ENCOUNTER — Ambulatory Visit: Payer: Medicare Other | Admitting: Physician Assistant

## 2021-07-19 VITALS — BP 130/77 | HR 75 | Ht 65.0 in | Wt 184.0 lb

## 2021-07-19 DIAGNOSIS — N281 Cyst of kidney, acquired: Secondary | ICD-10-CM

## 2021-07-19 DIAGNOSIS — N182 Chronic kidney disease, stage 2 (mild): Secondary | ICD-10-CM | POA: Diagnosis not present

## 2021-07-19 DIAGNOSIS — N390 Urinary tract infection, site not specified: Secondary | ICD-10-CM | POA: Diagnosis not present

## 2021-07-19 DIAGNOSIS — N393 Stress incontinence (female) (male): Secondary | ICD-10-CM

## 2021-07-19 DIAGNOSIS — R102 Pelvic and perineal pain: Secondary | ICD-10-CM | POA: Diagnosis not present

## 2021-07-19 LAB — BLADDER SCAN AMB NON-IMAGING

## 2021-07-19 NOTE — Progress Notes (Signed)
07/19/2021 3:21 PM   Mary Vega 1945/12/04 [id number]  CC: Chief Complaint  Patient presents with   Results   HPI: Mary Vega is a 75 y.o. female with PMH recurrent UTI, pyelonephritis, and ovarian cancer s/p chemotherapy who previously declined topical vaginal estrogen cream who presents today for follow-up and renal ultrasound results.   We addressed several issues in clinic today as below:   Recurrent UTI She reports no UTIs since I last treated her in clinic 9 months ago. No dysuria today.  Renal ultrasound results RUS dated 06/26/2021 with no shadowing stones or hydronephrosis. She has a stable, Bosniak 1 cyst of the lower right kidney. Patient reports her father had a history of polycystic kidney disease and she prefers annual monitoring of this cyst because she reports her ovarian cancer also started as a simple cyst.  Voiding symptoms She reports chronic weak stream, intermittency, suprapubic pressure, and straining to void. She is concerned there could be a narrowing in her urethra. PVR 62m today. No history of pelvic radiation.  She also reports a history of small volume stress incontinence, worse when her bladder is full. She denies urge incontinence.  Chronic kidney disease She reports concerns regarding chronic kidney disease. Creatinine 0.90 in February 2022 with eGFR 63; patient reports her PCP drew this again in October 2022 with GFR again around 60.  PMH: Past Medical History:  Diagnosis Date   Anxiety    takes Lorazepam daily   Chronic lower back pain    buldging disc   Constipation    takes Stool Softener daily as needed   Depression    takes Zoloft daily   GERD (gastroesophageal reflux disease)    takes Omeprazole daily   History of blood transfusion    "when I had cancer surgery; hysterectomy"   History of bronchitis    Sept 2016   History of colon polyps    benign   Hyperlipidemia    takes Simvastatin daily   Hypertension     takes Losartan daily    Insomnia    takes Trazodone nightly   Kidney stone    "must have passed it"   [place]A on CPAP    [place]    Peripheral edema    takes Spironolactone daily   Peripheral neuropathy    Pneumonia ~ 2012   PONV (postoperative nausea and vomiting)    Sinus headache    Urinary frequency    UTI (lower urinary tract infection)    being treated with Macrobid,completed today 11/11/15    Surgical History: Past Surgical History:  Procedure Laterality Date   ABDOMINAL HYSTERECTOMY     CATARACT EXTRACTION W/ INTRAOCULAR LENS  IMPLANT, BILATERAL Bilateral    COLONOSCOPY     DIAGNOSTIC LAPAROSCOPY     to drain cyst on ovary   THYROID LOBECTOMY Right 11/17/2015   THYROID SURGERY  X 2   goiter removed   THYROIDECTOMY Right 11/17/2015   Procedure: RIGHT THYROID LOBECTOMY WITH FROZEN SECTION;  Surgeon: JMelissa Montane MD;  [facility]  Service: ENT;  Laterality: Right;    Home Medications:  Allergies as of 07/19/2021       Reactions   Iodine-131 Anaphylaxis, Itching   Ivp Dye [iodinated Diagnostic Agents] Anaphylaxis   Sulfa Antibiotics Hives, Other (See Comments)   Septic?   Sulfamethoxazole Hives, Rash   Clams [shellfish Allergy] Diarrhea, Nausea And Vomiting   [employer] fundraiser(m. Mercenaria) Skin Test Diarrhea, Nausea And Vomiting  Phenobarbital Other (See Comments)   Tremor (intolerance)        Medication List        Accurate as of July 19, 2021  3:21 PM. If you have any questions, ask your nurse or doctor.          amLODipine 2.5 MG tablet Commonly known as: NORVASC Take 2.5 mg by mouth daily.   famotidine 20 MG tablet Commonly known as: PEPCID Take 20 mg by mouth 2 (two) times daily.   ipratropium 0.06 % nasal spray Commonly known as: ATROVENT Place 2 sprays into both nostrils 4 (four) times daily.   LORazepam 1 MG tablet Commonly known as: ATIVAN Take 1 mg by mouth every 6 (six) hours as needed for anxiety.    multivitamin with minerals Tabs tablet Take 1 tablet by mouth daily.   pantoprazole 40 MG tablet Commonly known as: PROTONIX Take 40 mg by mouth daily.   rosuvastatin 10 MG tablet Commonly known as: CRESTOR Take 10 mg by mouth daily.   sertraline 100 MG tablet Commonly known as: ZOLOFT Take 100 mg by mouth daily.   simvastatin 40 MG tablet Commonly known as: ZOCOR Take 40 mg by mouth at bedtime.   spironolactone 25 MG tablet Commonly known as: ALDACTONE Take 25 mg by mouth 2 (two) times daily.   traZODone 100 MG tablet Commonly known as: DESYREL Take 200 mg by mouth at bedtime.        Allergies:  Allergies  Allergen Reactions   Iodine-131 Anaphylaxis and Itching   Ivp Dye [Iodinated Diagnostic Agents] Anaphylaxis   Sulfa Antibiotics Hives and Other (See Comments)    Septic?   Sulfamethoxazole Hives and Rash   Clams [Shellfish Allergy] Diarrhea and Nausea And Vomiting   [place] Clam (M. Mercenaria) Skin Test Diarrhea and Nausea And Vomiting   Phenobarbital Other (See Comments)    Tremor (intolerance)    Family History: No family history on file.  Social History:   reports that she has quit smoking. Her smoking use included cigarettes. She has a 23.50 pack-year smoking history. She has never used smokeless tobacco. She reports that she does not drink alcohol and does not use drugs.  Physical Exam: BP 130/77   Pulse 75   Ht _0  (1.651 m)   Wt 184 lb (83.5 kg)   BMI 30.62 kg/m   Constitutional:  Alert and oriented, no acute distress, nontoxic appearing HEENT: [place], AT Cardiovascular: No clubbing, cyanosis, or edema Respiratory: Normal respiratory effort, no increased work of breathing Skin: No rashes, bruises or suspicious lesions Neurologic: Grossly intact, no focal deficits, moving all 4 extremities Psychiatric: Normal mood and affect  Laboratory Data: Results for orders placed or performed in visit on 07/19/21  BLADDER SCAN AMB NON-IMAGING   Result Value Ref Range   Scan Result 58m    Pertinent Imaging: Results for orders placed during the hospital encounter of 06/26/21  [place]  Narrative CLINICAL DATA:  Renal cyst  EXAM: RENAL / URINARY TRACT ULTRASOUND COMPLETE  COMPARISON:  03/09/2019  FINDINGS: Right Kidney:  Renal measurements: 10.7 x 4.6 x 4.7 cm = volume: 119 mL. Echogenicity within normal limits. No mass or hydronephrosis visualized. Stable 1.2 cm simple cyst within the lower pole of the right kidney  Left Kidney:  Renal measurements: 9.5 x 3.9 x 4.0 cm = volume: 77 mL. Echogenicity within normal limits. No mass or hydronephrosis visualized.  Bladder:  Appears normal for degree of bladder distention.  Other:  None.  IMPRESSION: Stable Bosniak class 1 cyst within the lower pole the right kidney. No further follow-up is required. Otherwise unremarkable renal sonogram.   Electronically Signed By: Fidela Salisbury M.D. On: 06/27/2021 18:18  I personally reviewed the images referenced above and note a simple right renal cyst with no other significant findings.  Assessment & Plan:   1. Recurrent UTI No longer meets diagnostic criteria with 2 infections in the last year. Continue to monitor and treat as needed.  2. CKD (chronic kidney disease) stage 2, GFR 60-89 ml/min We discussed that there is no evidence of a structural cause of her CKD per her renal ultrasound. We discussed that typically patients consider seeing nephrology once they reach stage 3, however I would be happy to refer her sooner if she would like. She declined. Renal diet info provided on AVS.  3. Renal cyst We discussed that simple renal cysts are benign and do not require monitoring. We also discussed that her solitary cyst is not consistent with PCKD and that if she had PCKD, it would be sonographically apparent by now. Will continue with annual renal ultrasounds per patient request. - US RENAL; Future  4. Suprapubic  pressure Accompanied by obstructive voiding symptoms, though no bladder abnormalities seen on RUS and her PVR is WNL. Will obtain pelvic ultrasound to rule out external bladder compression. Otherwise we discussed that we may consider UDS in the future for her voiding symptoms, but if he is found to have an element of neurogenic bladder, management options will be nonoperative and I'll recommend conservative management since she continues to empty appropriately. - BLADDER SCAN AMB NON-IMAGING - US PELVIS (TRANSABDOMINAL ONLY); Future  5. Stress incontinence, female Minimal bother. We discussed referral to pelvic floor physical therapy today, but she declined.   Return for Will call with results + annual f/u with RUS prior.  Debroah Loop, PA-C  [facility] [address] [place], [place] [address] [phone]

## 2021-10-26 IMAGING — US US RENAL
1 series · 15 of 25 positions shown · non-contrast
Comparison: 03/09/2019

CLINICAL DATA: Renal cyst

EXAM:
RENAL / URINARY TRACT ULTRASOUND COMPLETE

[Series 1: us renal · 15 of 28 slices shown]
[im 1/28]
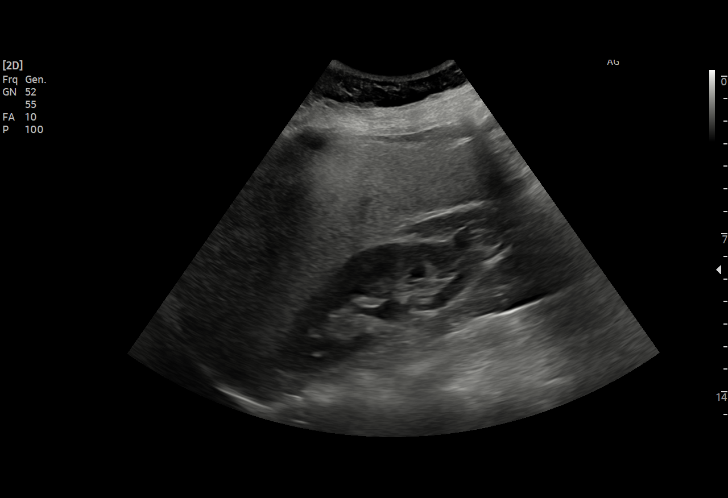
[im 3/28]
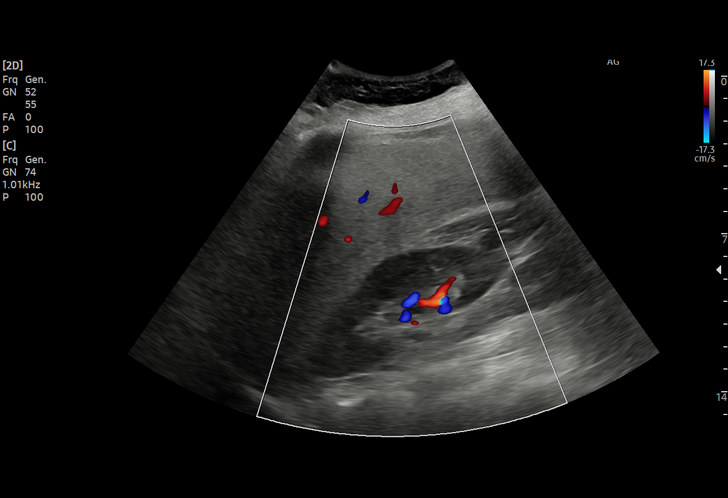
[im 5/28]
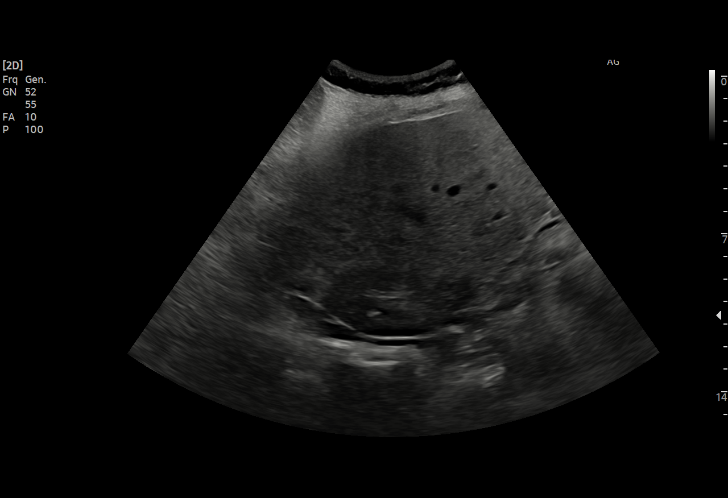
[im 6/28]
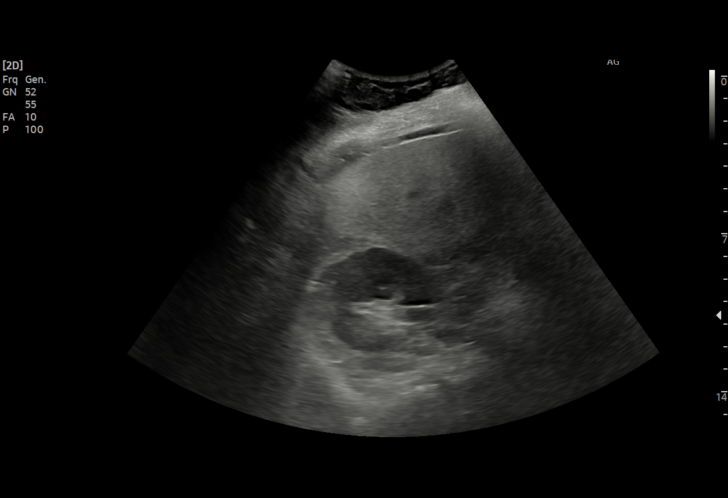
[im 8/28]
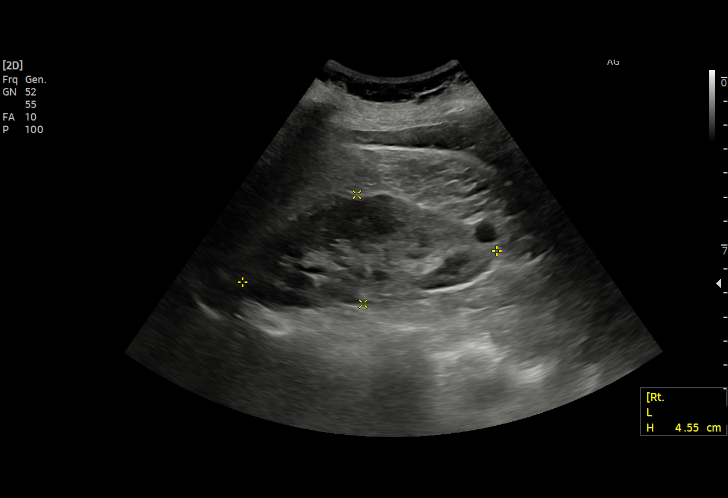
[im 11/28]
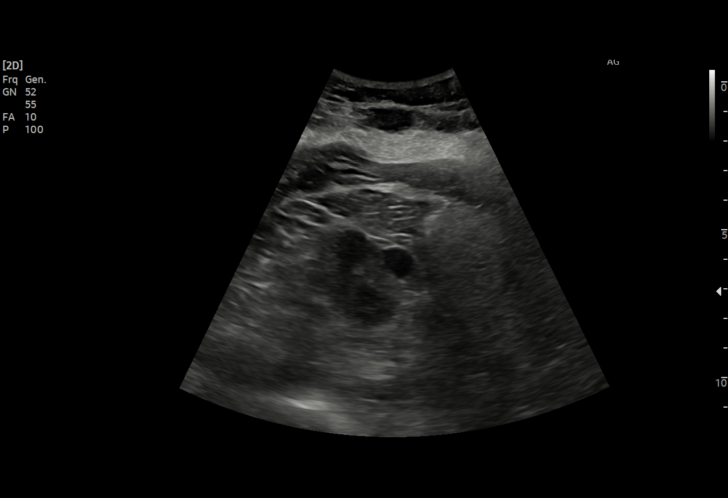
[im 12/28]
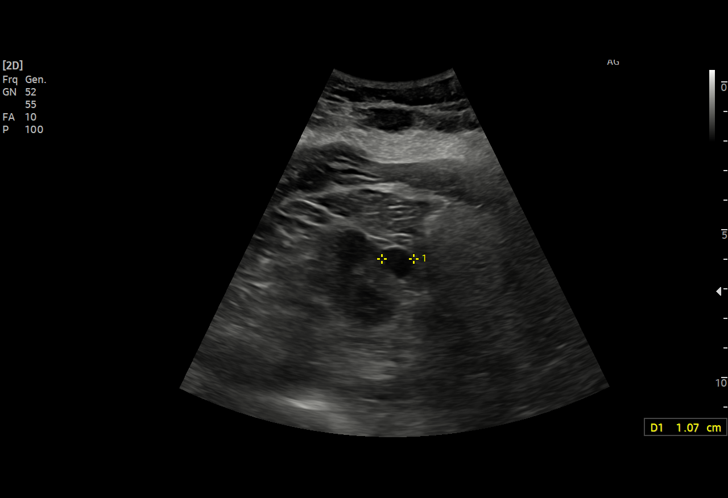
[im 14/28]
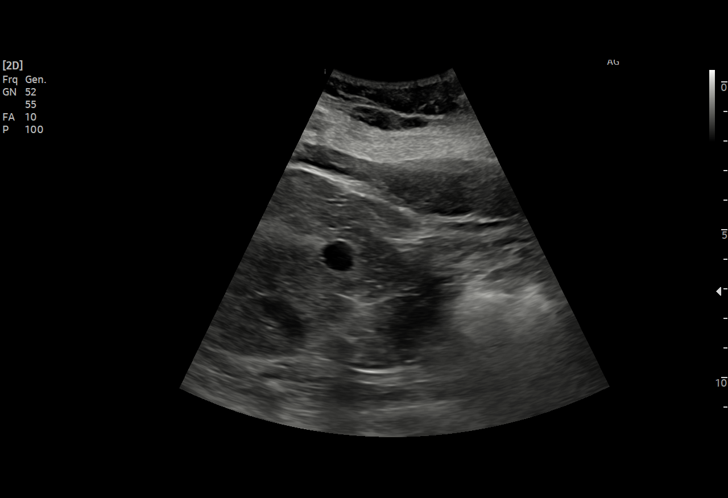
[im 16/28]
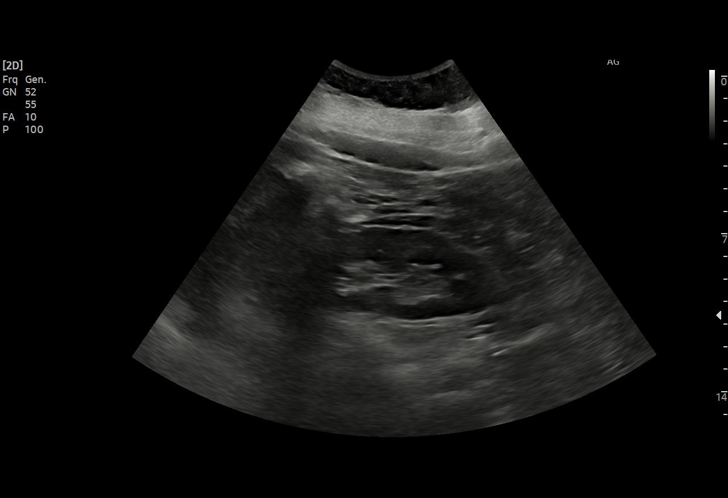
[im 17/28]
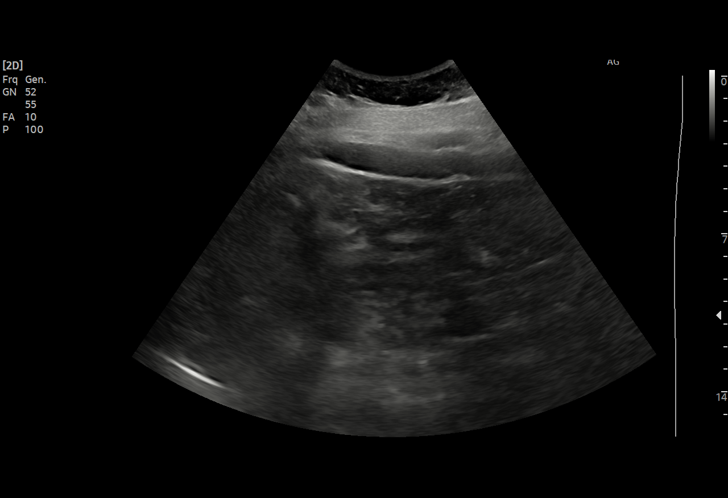
[im 20/28]
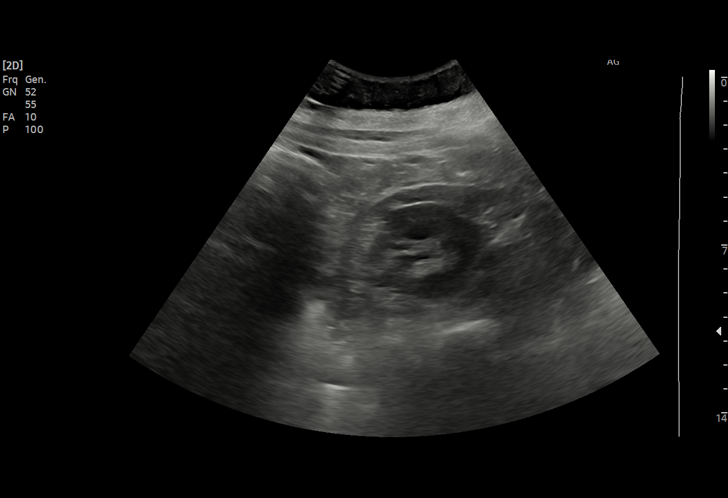
[im 22/28]
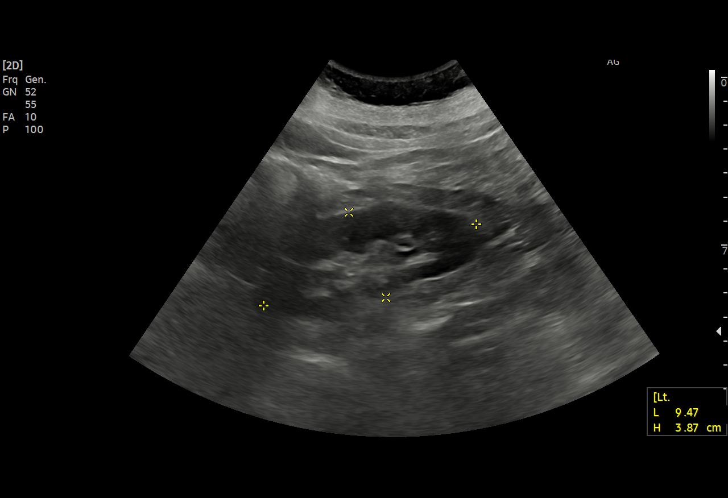
[im 23/28]
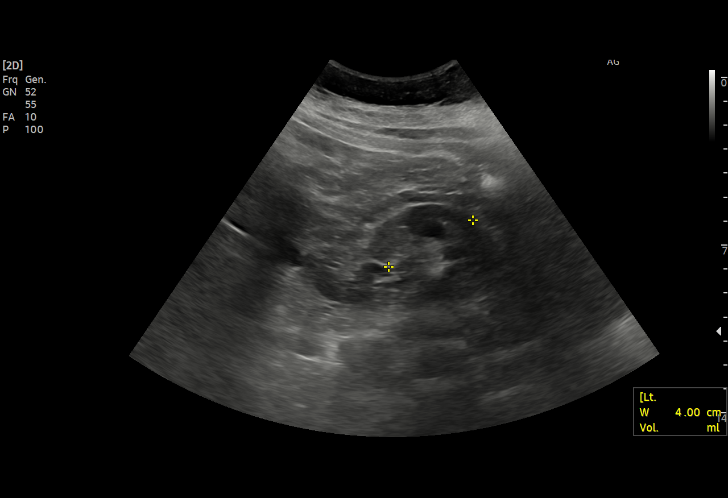
[im 25/28]
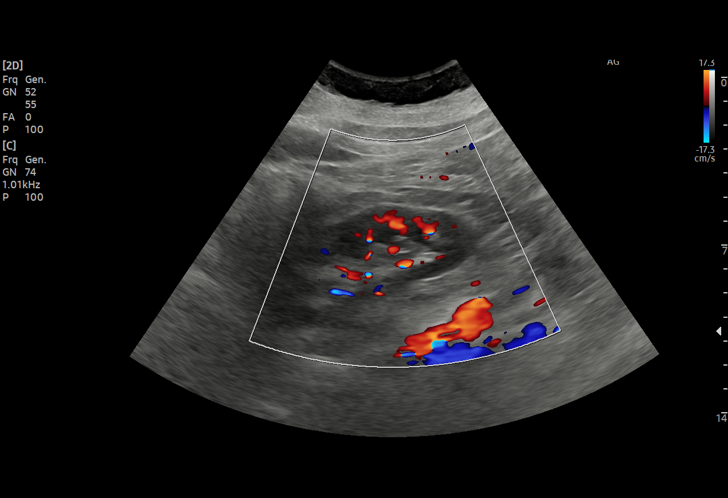
[im 28/28]
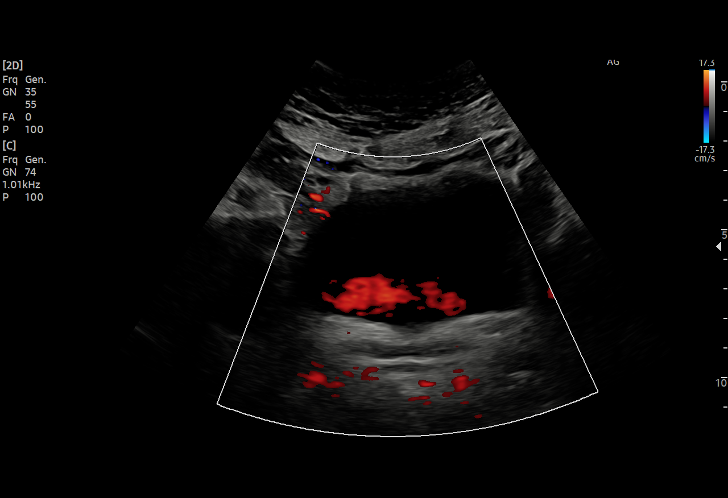

[15 of 25 positions shown; findings below may reference images not displayed]

FINDINGS: Right Kidney:

Renal measurements: 10.7 x 4.6 x 4.7 cm = volume: 119 mL.
Echogenicity within normal limits. No mass or hydronephrosis
visualized. Stable 1.2 cm simple cyst within the lower pole of the
right kidney

Left Kidney:

Renal measurements: 9.5 x 3.9 x 4.0 cm = volume: 77 mL. Echogenicity
within normal limits. No mass or hydronephrosis visualized.

Bladder:

Appears normal for degree of bladder distention.

Other:

None.
IMPRESSION: Stable Bosniak class 1 cyst within the lower pole the right kidney.
No further follow-up is required. Otherwise unremarkable renal
sonogram.

## 2022-01-23 NOTE — Progress Notes (Unsigned)
01/24/2022 1:49 PM   Mary Vega 11/29/1945 [id number]  Referring provider: Nicola Girt, [address] [place],  [place] [address]  Urological history 1. rUTI's -Contributing factors of age, chemotherapy and vaginal atrophy -RUS, 2022 - right renal cyst -cysto, 2020 - NED -Documented positive urine cultures over the last year  None -Managed with cranberry tablets, probiotics and d-mannose  2. Incontinence -Contributing factors of age, vaginal atrophy, pelvic radiation, pelvic surgery, anxiety, chronic lower back pain, constipation, depression, hypertension, insomnia, sleep apnea, peripheral edema and former smoker   Chief Complaint  Patient presents with   Urinary Tract Infection   HPI: Mary Vega is a 76 y.o. female who presents today for possible uti.   She has circled burning/painful urination, back pain, lower abdominal pain and nausea on review of systems sheet.  She started having nausea over the weekend and then Monday morning she started to experience burning/painful urination, LBP and suprapubic pain that has been increasing in severity over the last few days.    Patient denies any modifying or aggravating factors.  Patient denies any gross hematuria or suprapubic/flank pain.  Patient denies any fevers, chills or vomiting.    UA > 30 WBC's, 11-30 RBC's and moderate bacteria.     PMH: Past Medical History:  Diagnosis Date   Anxiety    takes Lorazepam daily   Chronic lower back pain    buldging disc   Constipation    takes Stool Softener daily as needed   Depression    takes Zoloft daily   GERD (gastroesophageal reflux disease)    takes Omeprazole daily   History of blood transfusion    "when I had cancer surgery; hysterectomy"   History of bronchitis    Sept 2016   History of colon polyps    benign   Hyperlipidemia    takes Simvastatin daily   Hypertension    takes Losartan daily    Insomnia    takes Trazodone  nightly   Kidney stone    "must have passed it"   OSA on CPAP    Ovarian cancer ([place])    Peripheral edema    takes Spironolactone daily   Peripheral neuropathy    Pneumonia ~ 2012   PONV (postoperative nausea and vomiting)    Sinus headache    Urinary frequency    UTI (lower urinary tract infection)    being treated with Macrobid,completed today 11/11/15    Surgical History: Past Surgical History:  Procedure Laterality Date   ABDOMINAL HYSTERECTOMY     CATARACT EXTRACTION W/ INTRAOCULAR LENS  IMPLANT, BILATERAL Bilateral    COLONOSCOPY     DIAGNOSTIC LAPAROSCOPY     to drain cyst on ovary   THYROID LOBECTOMY Right 11/17/2015   THYROID SURGERY  X 2   goiter removed   THYROIDECTOMY Right 11/17/2015   Procedure: RIGHT THYROID LOBECTOMY WITH FROZEN SECTION;  Surgeon: Melissa Montane, MD;  Location: [facility];  Service: ENT;  Laterality: Right;    Home Medications:  Allergies as of 01/24/2022       Reactions   Iodine-131 Anaphylaxis, Itching   Ivp Dye [iodinated Contrast Media] Anaphylaxis   Sulfa Antibiotics Hives, Other (See Comments)   Septic?   Sulfamethoxazole Hives, Rash   Clams [shellfish Allergy] Diarrhea, Nausea And Vomiting   [employer] Char[employer] fundraiser (m. Mercenaria) Skin Test Diarrhea, Nausea And Vomiting   Phenobarbital Other (See Comments)   Tremor (intolerance)  Medication List        Accurate as of Jan 24, 2022  1:49 PM. If you have any questions, ask your nurse or doctor.          amLODipine 2.5 MG tablet Commonly known as: NORVASC Take 2.5 mg by mouth daily.   cephALEXin 500 MG capsule Commonly known as: KEFLEX Take 1 capsule (500 mg total) by mouth 2 (two) times daily for 10 days.   famotidine 20 MG tablet Commonly known as: PEPCID Take 20 mg by mouth 2 (two) times daily.   ipratropium 0.06 % nasal spray Commonly known as: ATROVENT Place 2 sprays into both nostrils 4 (four) times daily.   LORazepam 1 MG tablet Commonly known as: ATIVAN Take  1 mg by mouth every 6 (six) hours as needed for anxiety.   multivitamin with minerals Tabs tablet Take 1 tablet by mouth daily.   pantoprazole 40 MG tablet Commonly known as: PROTONIX Take 40 mg by mouth daily.   rosuvastatin 10 MG tablet Commonly known as: CRESTOR Take 10 mg by mouth daily.   sertraline 100 MG tablet Commonly known as: ZOLOFT Take 100 mg by mouth daily.   simvastatin 40 MG tablet Commonly known as: ZOCOR Take 40 mg by mouth at bedtime.   spironolactone 25 MG tablet Commonly known as: ALDACTONE Take 25 mg by mouth 2 (two) times daily.   traZODone 100 MG tablet Commonly known as: DESYREL Take 200 mg by mouth at bedtime.        Allergies:  Allergies  Allergen Reactions   Iodine-131 Anaphylaxis and Itching   Ivp Dye [Iodinated Contrast Media] Anaphylaxis   Sulfa Antibiotics Hives and Other (See Comments)    Septic?   Sulfamethoxazole Hives and Rash   Clams [Shellfish Allergy] Diarrhea and Nausea And Vomiting   [place] Clam (M. Mercenaria) Skin Test Diarrhea and Nausea And Vomiting   Phenobarbital Other (See Comments)    Tremor (intolerance)    Family History: No family history on file.  Social History:  reports that she has quit smoking. Her smoking use included cigarettes. She has a 23.50 pack-year smoking history. She has never used smokeless tobacco. She reports that she does not drink alcohol and does not use drugs.  ROS: Pertinent ROS in HPI  Physical Exam: BP (!) 164/79   Pulse 73   Ht '5\' 5"'$  (1.651 m)   Wt 180 lb (81.6 kg)   BMI 29.95 kg/m   Constitutional:  Well nourished. Alert and oriented, No acute distress. HEENT: [place] AT, moist mucus membranes.  Trachea midline Cardiovascular: No clubbing, cyanosis, or edema. Respiratory: Normal respiratory effort, no increased work of breathing. Neurologic: Grossly intact, no focal deficits, moving all 4 extremities. Psychiatric: Normal mood and affect.    Laboratory  Data: Urinalysis See EPIC I have reviewed the labs.   Pertinent Imaging: N/A  Assessment & Plan:    1. Suspected UTI -UA grossly infected -urine culture sent  -Keflex 500 mg twice daily until sensitivities   2. Microscopic hematuria -UA with microscopic hematuria, but likely secondary to UTI -We will have her follow-up in 1 month for symptom recheck and repeat UA to ensure hematuria clears with the treating of the infection  Return in about 1 month (around 02/24/2022) for UA and symptom recheck.  These notes generated with voice recognition software. I apologize for typographical errors.  Zara Council, PA-C  [facility] [address]  [place], [place] [address] [phone]

## 2022-01-24 ENCOUNTER — Ambulatory Visit: Payer: Medicare Other | Admitting: Urology

## 2022-01-24 ENCOUNTER — Encounter: Payer: Self-pay | Admitting: Urology

## 2022-01-24 VITALS — BP 164/79 | HR 73 | Ht 65.0 in | Wt 180.0 lb

## 2022-01-24 DIAGNOSIS — R110 Nausea: Secondary | ICD-10-CM

## 2022-01-24 DIAGNOSIS — M549 Dorsalgia, unspecified: Secondary | ICD-10-CM

## 2022-01-24 DIAGNOSIS — R1030 Lower abdominal pain, unspecified: Secondary | ICD-10-CM

## 2022-01-24 DIAGNOSIS — R3129 Other microscopic hematuria: Secondary | ICD-10-CM

## 2022-01-24 DIAGNOSIS — R300 Dysuria: Secondary | ICD-10-CM | POA: Diagnosis not present

## 2022-01-24 DIAGNOSIS — R3989 Other symptoms and signs involving the genitourinary system: Secondary | ICD-10-CM

## 2022-01-24 MED ORDER — CEPHALEXIN 500 MG PO CAPS: 500.0000 mg | ORAL_CAPSULE | Freq: Two times a day (BID) | ORAL | 0 refills | Status: AC

## 2022-01-25 LAB — MICROSCOPIC EXAMINATION: WBC, UA: >30 /hpf — AB (ref 0–5)

## 2022-01-25 LAB — URINALYSIS, COMPLETE
Bilirubin, UA: NEGATIVE
Glucose, UA: NEGATIVE
Ketones, UA: NEGATIVE
Nitrite, UA: NEGATIVE
Protein,UA: NEGATIVE
Specific Gravity, UA: 1.025 (ref 1.005–1.030)
Urobilinogen, Ur: 0.2 mg/dL (ref 0.2–1.0)
pH, UA: 5.5 (ref 5.0–7.5)

## 2022-01-28 LAB — CULTURE, URINE COMPREHENSIVE

## 2022-02-22 ENCOUNTER — Ambulatory Visit: Payer: Medicare Other | Admitting: Physician Assistant

## 2022-02-22 ENCOUNTER — Encounter: Payer: Self-pay | Admitting: Physician Assistant

## 2022-02-22 VITALS — BP 158/79 | HR 66 | Ht 64.0 in | Wt 179.0 lb

## 2022-02-22 DIAGNOSIS — N390 Urinary tract infection, site not specified: Secondary | ICD-10-CM

## 2022-02-22 DIAGNOSIS — Z87440 Personal history of urinary (tract) infections: Secondary | ICD-10-CM | POA: Diagnosis not present

## 2022-02-22 NOTE — Progress Notes (Unsigned)
02/22/2022 2:56 PM   Mary Vega [birth year] [id number]  CC: Chief Complaint  Patient presents with   Dysuria   HPI: Mary Vega is a 76 y.o. female with PMH recurrent UTI, renal cyst, lower urinary tract symptoms, CKD, and ovarian cancer s/p chemotherapy who presents today for repeat UA and symptom recheck after being treated for acute cystitis by [facility] last month.   Today she reports no bothersome urinary symptoms.  Her dysuria, suprapubic pain, and nausea resolved after taking culture appropriate Keflex for E. coli UTI.  She is being worked up for an autoimmune condition right now and states this has been putting her under stress.  In-office UA and microscopy today pan negative.  PMH: Past Medical History:  Diagnosis Date   Anxiety    takes Lorazepam daily   Chronic lower back pain    buldging disc   Constipation    takes Stool Softener daily as needed   Depression    takes Zoloft daily   GERD (gastroesophageal reflux disease)    takes Omeprazole daily   History of blood transfusion    "when I had cancer surgery; hysterectomy"   History of bronchitis    Sept 2016   History of colon polyps    benign   Hyperlipidemia    takes Simvastatin daily   Hypertension    takes Losartan daily    Insomnia    takes Trazodone nightly   Kidney stone    "must have passed it"   OSA on CPAP    Ovarian cancer ([place])    Peripheral edema    takes Spironolactone daily   Peripheral neuropathy    Pneumonia ~ 2012   PONV (postoperative nausea and vomiting)    Sinus headache    Urinary frequency    UTI (lower urinary tract infection)    being treated with Macrobid,completed today 11/11/15    Surgical History: Past Surgical History:  Procedure Laterality Date   ABDOMINAL HYSTERECTOMY     CATARACT EXTRACTION W/ INTRAOCULAR LENS  IMPLANT, BILATERAL Bilateral    COLONOSCOPY     DIAGNOSTIC LAPAROSCOPY     to drain cyst on ovary   THYROID LOBECTOMY Right 11/17/2015    THYROID SURGERY  X 2   goiter removed   THYROIDECTOMY Right 11/17/2015   Procedure: RIGHT THYROID LOBECTOMY WITH FROZEN SECTION;  Surgeon: Melissa Montane, MD;  Location: [facility];  Service: ENT;  Laterality: Right;    Home Medications:  Allergies as of 02/22/2022       Reactions   Iodine-131 Anaphylaxis, Itching   Ivp Dye [iodinated Contrast Media] Anaphylaxis   Sulfa Antibiotics Hives, Other (See Comments)   Septic?   Sulfamethoxazole Hives, Rash   Clams [shellfish Allergy] Diarrhea, Nausea And Vomiting   [place] Clam (m. Mercenaria) Skin Test Diarrhea, Nausea And Vomiting   Phenobarbital Other (See Comments)   Tremor (intolerance)        Medication List        Accurate as of February 22, 2022  2:56 PM. If you have any questions, ask your nurse or doctor.          STOP taking these medications    famotidine 20 MG tablet Commonly known as: PEPCID Stopped by: Debroah Loop, PA-C       TAKE these medications    amLODipine 2.5 MG tablet Commonly known as: NORVASC Take 2.5 mg by mouth daily.   ipratropium 0.06 % nasal spray Commonly known as: ATROVENT Place 2  sprays into both nostrils 4 (four) times daily.   LORazepam 1 MG tablet Commonly known as: ATIVAN Take 1 mg by mouth every 6 (six) hours as needed for anxiety.   multivitamin with minerals Tabs tablet Take 1 tablet by mouth daily.   pantoprazole 40 MG tablet Commonly known as: PROTONIX Take 40 mg by mouth daily.   rosuvastatin 10 MG tablet Commonly known as: CRESTOR Take 10 mg by mouth daily.   sertraline 100 MG tablet Commonly known as: ZOLOFT Take 100 mg by mouth daily.   simvastatin 40 MG tablet Commonly known as: ZOCOR Take 40 mg by mouth at bedtime.   spironolactone 25 MG tablet Commonly known as: ALDACTONE Take 25 mg by mouth 2 (two) times daily.   traZODone 100 MG tablet Commonly known as: DESYREL Take 200 mg by mouth at bedtime.        Allergies:  Allergies  Allergen  Reactions   Iodine-131 Anaphylaxis and Itching   Ivp Dye [Iodinated Contrast Media] Anaphylaxis   Sulfa Antibiotics Hives and Other (See Comments)    Septic?   Sulfamethoxazole Hives and Rash   Clams [Shellfish Allergy] Diarrhea and Nausea And Vomiting   [place] Clam (M. Mercenaria) Skin Test Diarrhea and Nausea And Vomiting   Phenobarbital Other (See Comments)    Tremor (intolerance)    Family History: History reviewed. No pertinent family history.  Social History:   reports that she has quit smoking. Her smoking use included cigarettes. She has a 23.50 pack-year smoking history. She has never used smokeless tobacco. She reports that she does not drink alcohol and does not use drugs.  Physical Exam: BP (!) 158/79   Pulse 66   Ht '5\' 4"'$  (1.626 m)   Wt 179 lb (81.2 kg)   BMI 30.73 kg/m   Constitutional:  Alert and oriented, no acute distress, nontoxic appearing HEENT: [place], AT Cardiovascular: No clubbing, cyanosis, or edema Respiratory: Normal respiratory effort, no increased work of breathing Skin: No rashes, bruises or suspicious lesions Neurologic: Grossly intact, no focal deficits, moving all 4 extremities Psychiatric: Normal mood and affect  Laboratory Data: Results for orders placed or performed in visit on 01/24/22  CULTURE, URINE COMPREHENSIVE   Specimen: Urine   UR  Result Value Ref Range   Urine Culture, Comprehensive Final report (A)    Organism ID, Bacteria Escherichia coli (A)    ANTIMICROBIAL SUSCEPTIBILITY Comment   Microscopic Examination   Urine  Result Value Ref Range   WBC, UA >30 (A) 0 - 5 /hpf   RBC 11-30 (A) 0 - 2 /hpf   Epithelial Cells (non renal) 0-10 0 - 10 /hpf   Bacteria, UA Moderate (A) None seen/Few  Urinalysis, Complete  Result Value Ref Range   Specific Gravity, UA 1.025 1.005 - 1.030   pH, UA 5.5 5.0 - 7.5   Color, UA Yellow Yellow   Appearance Ur Hazy (A) Clear   Leukocytes,UA 1+ (A) Negative   Protein,UA Negative  Negative/Trace   Glucose, UA Negative Negative   Ketones, UA Negative Negative   RBC, UA 1+ (A) Negative   Bilirubin, UA Negative Negative   Urobilinogen, Ur 0.2 0.2 - 1.0 mg/dL   Nitrite, UA Negative Negative   Microscopic Examination See below:    Assessment & Plan:   1. Recurrent UTI Symptoms resolved after taking culture appropriate antibiotics for E. coli UTI.  Microscopic hematuria also resolved.  No further treatment indicated.  Okay to follow-up as needed. - Urinalysis, Complete  Return if symptoms worsen or fail to improve.  Debroah Loop, PA-C  [facility] [address] [place], [place] [address] [phone]

## 2022-02-23 LAB — URINALYSIS, COMPLETE
Bilirubin, UA: NEGATIVE
Glucose, UA: NEGATIVE
Ketones, UA: NEGATIVE
Leukocytes,UA: NEGATIVE
Nitrite, UA: NEGATIVE
Protein,UA: NEGATIVE
RBC, UA: NEGATIVE
Specific Gravity, UA: 1.01 (ref 1.005–1.030)
Urobilinogen, Ur: 0.2 mg/dL (ref 0.2–1.0)
pH, UA: 5.5 (ref 5.0–7.5)

## 2022-02-23 LAB — MICROSCOPIC EXAMINATION: Bacteria, UA: NONE SEEN

## 2022-02-28 ENCOUNTER — Ambulatory Visit: Payer: Medicare Other | Admitting: Physician Assistant

## 2022-03-08 ENCOUNTER — Other Ambulatory Visit: Payer: Self-pay | Admitting: Neurological Surgery

## 2022-03-08 DIAGNOSIS — M5416 Radiculopathy, lumbar region: Secondary | ICD-10-CM

## 2022-03-19 ENCOUNTER — Ambulatory Visit
Admission: RE | Admit: 2022-03-19 | Discharge: 2022-03-19 | Disposition: A | Discharge status: Home / Self Care | Payer: Medicare Other | Source: Ambulatory Visit | Attending: Neurological Surgery | Admitting: Neurological Surgery

## 2022-03-19 DIAGNOSIS — M5416 Radiculopathy, lumbar region: Secondary | ICD-10-CM | POA: Insufficient documentation

## 2022-07-16 ENCOUNTER — Ambulatory Visit
Admission: RE | Admit: 2022-07-16 | Discharge: 2022-07-16 | Disposition: A | Discharge status: Home / Self Care | Payer: Medicare Other | Source: Ambulatory Visit | Attending: Physician Assistant | Admitting: Physician Assistant

## 2022-07-16 ENCOUNTER — Ambulatory Visit: Payer: Medicare Other

## 2022-07-16 DIAGNOSIS — R102 Pelvic and perineal pain: Secondary | ICD-10-CM | POA: Diagnosis present

## 2022-07-16 DIAGNOSIS — N281 Cyst of kidney, acquired: Secondary | ICD-10-CM | POA: Insufficient documentation

## 2022-07-19 ENCOUNTER — Ambulatory Visit: Payer: Medicare Other | Admitting: Physician Assistant

## 2022-07-19 VITALS — BP 124/72 | HR 64 | Ht 64.0 in | Wt 179.0 lb

## 2022-07-19 DIAGNOSIS — N281 Cyst of kidney, acquired: Secondary | ICD-10-CM

## 2022-07-19 DIAGNOSIS — Z87440 Personal history of urinary (tract) infections: Secondary | ICD-10-CM | POA: Diagnosis not present

## 2022-07-19 DIAGNOSIS — N390 Urinary tract infection, site not specified: Secondary | ICD-10-CM

## 2022-07-19 NOTE — Progress Notes (Signed)
07/19/2022 5:07 PM   Mary Vega 1946/08/23 [id number]  CC: Chief Complaint  Patient presents with   Follow-up   HPI: Mary Vega is a 76 y.o. female with PMH recurrent UTI, right renal cyst, LUTS, CKD, and ovarian cancer s/p chemotherapy who presents today for annual follow-up and renal ultrasound results.   Today she reports no significant changes in her health over the past year.  She has not had any UTIs in about 6 months.  Renal ultrasound dated 07/16/2022 with 2 small, Bosniak 1 renal cysts without hydronephrosis.  Today she reports she prefers to continue annual monitoring of her renal cysts because her father died from polycystic kidney disease and for ovarian cancer started as a simple cyst.  PMH: Past Medical History:  Diagnosis Date   Anxiety    takes Lorazepam daily   Chronic lower back pain    buldging disc   Constipation    takes Stool Softener daily as needed   Depression    takes Zoloft daily   GERD (gastroesophageal reflux disease)    takes Omeprazole daily   History of blood transfusion    "when I had cancer surgery; hysterectomy"   History of bronchitis    Sept 2016   History of colon polyps    benign   Hyperlipidemia    takes Simvastatin daily   Hypertension    takes Losartan daily    Insomnia    takes Trazodone nightly   Kidney stone    "must have passed it"   OSA on CPAP    Ovarian cancer ([place])    Peripheral edema    takes Spironolactone daily   Peripheral neuropathy    Pneumonia ~ 2012   PONV (postoperative nausea and vomiting)    Sinus headache    Urinary frequency    UTI (lower urinary tract infection)    being treated with Macrobid,completed today 11/11/15    Surgical History: Past Surgical History:  Procedure Laterality Date   ABDOMINAL HYSTERECTOMY     CATARACT EXTRACTION W/ INTRAOCULAR LENS  IMPLANT, BILATERAL Bilateral    COLONOSCOPY     DIAGNOSTIC LAPAROSCOPY     to drain cyst on ovary   THYROID LOBECTOMY  Right 11/17/2015   THYROID SURGERY  X 2   goiter removed   THYROIDECTOMY Right 11/17/2015   Procedure: RIGHT THYROID LOBECTOMY WITH FROZEN SECTION;  Surgeon: Melissa Montane, MD;  Location: [facility];  Service: ENT;  Laterality: Right;    Home Medications:  Allergies as of 07/19/2022       Reactions   Iodine-131 Anaphylaxis, Itching   Ivp Dye [iodinated Contrast Media] Anaphylaxis   Sulfa Antibiotics Hives, Other (See Comments)   Septic?   Sulfamethoxazole Hives, Rash   Clams [shellfish Allergy] Diarrhea, Nausea And Vomiting   [place] Clam (m. Mercenaria) Skin Test Diarrhea, Nausea And Vomiting   Phenobarbital Other (See Comments)   Tremor (intolerance)        Medication List        Accurate as of July 19, 2022  5:07 PM. If you have any questions, ask your nurse or doctor.          amLODipine 2.5 MG tablet Commonly known as: NORVASC Take 2.5 mg by mouth daily.   ipratropium 0.06 % nasal spray Commonly known as: ATROVENT Place 2 sprays into both nostrils 4 (four) times daily.   LORazepam 1 MG tablet Commonly known as: ATIVAN Take 1 mg by mouth every 6 (six) hours  as needed for anxiety.   multivitamin with minerals Tabs tablet Take 1 tablet by mouth daily.   pantoprazole 40 MG tablet Commonly known as: PROTONIX Take 40 mg by mouth daily.   rosuvastatin 10 MG tablet Commonly known as: CRESTOR Take 10 mg by mouth daily.   sertraline 100 MG tablet Commonly known as: ZOLOFT Take 100 mg by mouth daily.   simvastatin 40 MG tablet Commonly known as: ZOCOR Take 40 mg by mouth at bedtime.   spironolactone 25 MG tablet Commonly known as: ALDACTONE Take 25 mg by mouth 2 (two) times daily.   traZODone 100 MG tablet Commonly known as: DESYREL Take 200 mg by mouth at bedtime.        Allergies:  Allergies  Allergen Reactions   Iodine-131 Anaphylaxis and Itching   Ivp Dye [Iodinated Contrast Media] Anaphylaxis   Sulfa Antibiotics Hives and Other (See  Comments)    Septic?   Sulfamethoxazole Hives and Rash   Clams [Shellfish Allergy] Diarrhea and Nausea And Vomiting   [place] Clam (M. Mercenaria) Skin Test Diarrhea and Nausea And Vomiting   Phenobarbital Other (See Comments)    Tremor (intolerance)    Family History: No family history on file.  Social History:   reports that she has quit smoking. Her smoking use included cigarettes. She has a 23.50 pack-year smoking history. She has never used smokeless tobacco. She reports that she does not drink alcohol and does not use drugs.  Physical Exam: BP 124/72   Pulse 64   Ht '5\' 4"'$  (1.626 m)   Wt 179 lb (81.2 kg)   BMI 30.73 kg/m   Constitutional:  Alert and oriented, no acute distress, nontoxic appearing HEENT: [place], AT Cardiovascular: No clubbing, cyanosis, or edema Respiratory: Normal respiratory effort, no increased work of breathing Skin: No rashes, bruises or suspicious lesions Neurologic: Grossly intact, no focal deficits, moving all 4 extremities Psychiatric: Normal mood and affect  Pertinent Imaging: Results for orders placed during the hospital encounter of 07/16/22  US RENAL  Narrative CLINICAL DATA:  Renal cysts.  EXAM: RENAL / URINARY TRACT ULTRASOUND COMPLETE  COMPARISON:  Renal ultrasound February 19, 2022  FINDINGS: Right Kidney:  Renal measurements: 10.2 x 4.3 x 6.0 cm = volume: 136 mL. Contains 2 cysts. The lower pole cyst measures 10 mm, unchanged. No follow-up recommended for either cyst.  Left Kidney:  Renal measurements: 10.2 x 4.4 x 4.1 cm = volume: 95 mL. Echogenicity within normal limits. No mass or hydronephrosis visualized.  Bladder:  Appears normal for degree of bladder distention.  Other:  None.  IMPRESSION: No significant abnormalities.   Electronically Signed By: Dorise Bullion III M.D. On: 07/16/2022 16:26  I personally reviewed the images referenced above and note 2 simple right renal cysts.  Assessment & Plan:    1. Renal cyst I reassured the patient today that her 2 small simple renal cysts do not represent polycystic kidney disease, nor do I expect that they will develop into a polycystic kidney.  We also discussed that her cysts have none of the characteristics of complexity that we typically get concerned about with regard to potential for malignancy.  Even though follow-up imaging is not warranted, we will plan to repeat an annual renal ultrasound for reassurance.  2. Recurrent UTI Infection free x6 months.  We will continue to monitor.  Return in about 1 year (around 07/20/2023) for Annual f/u with RUS prior.  Debroah Loop, [facility] [address]  [address] [place], [place] [address] [phone]

## 2022-08-21 ENCOUNTER — Ambulatory Visit
Admission: EM | Admit: 2022-08-21 | Discharge: 2022-08-21 | Disposition: A | Discharge status: Home / Self Care | Payer: Medicare Other | Attending: Family Medicine | Admitting: Family Medicine

## 2022-08-21 DIAGNOSIS — R059 Cough, unspecified: Secondary | ICD-10-CM | POA: Insufficient documentation

## 2022-08-21 DIAGNOSIS — Z1152 Encounter for screening for COVID-19: Secondary | ICD-10-CM | POA: Insufficient documentation

## 2022-08-21 DIAGNOSIS — J40 Bronchitis, not specified as acute or chronic: Secondary | ICD-10-CM | POA: Diagnosis not present

## 2022-08-21 DIAGNOSIS — J329 Chronic sinusitis, unspecified: Secondary | ICD-10-CM

## 2022-08-21 DIAGNOSIS — J069 Acute upper respiratory infection, unspecified: Secondary | ICD-10-CM | POA: Diagnosis not present

## 2022-08-21 DIAGNOSIS — J029 Acute pharyngitis, unspecified: Secondary | ICD-10-CM | POA: Diagnosis not present

## 2022-08-21 DIAGNOSIS — Z79899 Other long term (current) drug therapy: Secondary | ICD-10-CM | POA: Insufficient documentation

## 2022-08-21 LAB — RESP PANEL BY RT-PCR (RSV, FLU A&B, COVID)  RVPGX2
Influenza A by PCR: NEGATIVE
Influenza B by PCR: NEGATIVE
Resp Syncytial Virus by PCR: NEGATIVE
SARS Coronavirus 2 by RT PCR: NEGATIVE

## 2022-08-21 MED ORDER — PROMETHAZINE-DM 6.25-15 MG/5ML PO SYRP: 5.0000 mL | ORAL_SOLUTION | Freq: Four times a day (QID) | ORAL | 0 refills | Status: DC | PRN

## 2022-08-21 NOTE — Discharge Instructions (Addendum)
We will contact you if your RSV/COVID/influenza test is positive. If your were prescribed medication, stop by the pharmacy to pick them up.   You can take Tylenol and/or Ibuprofen as needed for fever reduction and pain relief.    For cough: honey 1/2 to 1 teaspoon (you can dilute the honey in water or another fluid).  You can also use guaifenesin and dextromethorphan for cough. You can use a humidifier for chest congestion and cough.  If you don't have a humidifier, you can sit in the bathroom with the hot shower running.      For sore throat: try warm salt water gargles, Mucinex sore throat cough drops or cepacol lozenges, throat spray, warm tea or water with lemon/honey, popsicles or ice, or OTC cold relief medicine for throat discomfort. You can also purchase chloraseptic spray at the pharmacy or dollar store.   For congestion: take a daily anti-histamine like Zyrtec, Claritin, and a oral decongestant, such as pseudoephedrine.  You can also use Flonase 1-2 sprays in each nostril daily. Afrin is also a good option, if you do not have high blood pressure.    It is important to stay hydrated: drink plenty of fluids (water, gatorade/powerade/pedialyte, juices, or teas) to keep your throat moisturized and help further relieve irritation/discomfort.    Return or go to the Emergency Department if symptoms worsen or do not improve in the next few days

## 2022-08-21 NOTE — ED Provider Notes (Signed)
[facility]    CSN: [id number] Arrival date & time: 08/21/22  1126      History   Chief Complaint Chief Complaint  Patient presents with   Cough   Nasal Congestion   Sore Throat    HPI Mary Vega is a 76 y.o. female.   HPI   Mary Vega presents with her husband for cough and nasal congestion with sore throat that started on Sunday.  No medications tried prior to arrival.  She took a home COVID test and it was negative.  Has not been able to sleep at night due to her symptoms.  No known fever.  Of note, however her daughter-in-law and grandson have both been sick.  No vomiting, diarrhea, nausea.        Past Medical History:  Diagnosis Date   Anxiety    takes Lorazepam daily   Chronic lower back pain    buldging disc   Constipation    takes Stool Softener daily as needed   Depression    takes Zoloft daily   GERD (gastroesophageal reflux disease)    takes Omeprazole daily   History of blood transfusion    "when I had cancer surgery; hysterectomy"   History of bronchitis    Sept 2016   History of colon polyps    benign   Hyperlipidemia    takes Simvastatin daily   Hypertension    takes Losartan daily    Insomnia    takes Trazodone nightly   Kidney stone    "must have passed it"   OSA on CPAP    Ovarian cancer ([place])    Peripheral edema    takes Spironolactone daily   Peripheral neuropathy    Pneumonia ~ 2012   PONV (postoperative nausea and vomiting)    Sinus headache    Urinary frequency    UTI (lower urinary tract infection)    being treated with Macrobid,completed today 11/11/15    Patient Active Problem List   Diagnosis Date Noted   Depression 08/30/2020   Hyperparathyroidism ([place]) 06/28/2020   OSA (obstructive sleep apnea) 06/28/2020   Left lumbar radiculopathy 06/06/2020   Sacrococcygeal disorders, not elsewhere classified 09/32/3557   Complicated UTI (urinary tract infection) 03/09/2019   At risk for cardiovascular event  02/20/2019   History of acute pyelonephritis 02/20/2019   Acute pyelonephritis 02/14/2019   PVC's (premature ventricular contractions) 07/31/2016   Thyroid mass 11/17/2015   Benign neoplasm of thyroid 10/25/2015   Laryngopharyngeal reflux (LPR) 10/25/2015   DDD (degenerative disc disease), lumbar 09/01/2015   Essential hypertension 09/01/2015   GAD (generalized anxiety disorder) 09/01/2015   History of ovarian cancer 09/01/2015   Hypercalcemia 09/01/2015   Hyperlipidemia 09/01/2015   Multinodular goiter 09/01/2015    Past Surgical History:  Procedure Laterality Date   ABDOMINAL HYSTERECTOMY     CATARACT EXTRACTION W/ INTRAOCULAR LENS  IMPLANT, BILATERAL Bilateral    COLONOSCOPY     DIAGNOSTIC LAPAROSCOPY     to drain cyst on ovary   THYROID LOBECTOMY Right 11/17/2015   THYROID SURGERY  X 2   goiter removed   THYROIDECTOMY Right 11/17/2015   Procedure: RIGHT THYROID LOBECTOMY WITH FROZEN SECTION;  Surgeon: Melissa Montane, MD;  Location: [facility] OR;  Service: ENT;  Laterality: Right;    OB History   No obstetric history on file.      Home Medications    Prior to Admission medications   Medication Sig Start Date End Date Taking? Authorizing Provider  promethazine-dextromethorphan (PROMETHAZINE-DM) 6.25-15 MG/5ML syrup Take 5 mLs by mouth 4 (four) times daily as needed. 08/21/22  Yes Kobie Whidby, DO  amLODipine (NORVASC) 2.5 MG tablet Take 2.5 mg by mouth daily.    [provider]  ipratropium (ATROVENT) 0.06 % nasal spray Place 2 sprays into both nostrils 4 (four) times daily. 08/22/20   [provider]  LORazepam (ATIVAN) 1 MG tablet Take 1 mg by mouth every 6 (six) hours as needed for anxiety.    [provider]  Multiple Vitamin (MULTIVITAMIN WITH MINERALS) TABS tablet Take 1 tablet by mouth daily.    [provider]  pantoprazole (PROTONIX) 40 MG tablet Take 40 mg by mouth daily. 06/23/21   [provider]  rosuvastatin (CRESTOR) 10  MG tablet Take 10 mg by mouth daily. 02/20/19   [provider]  sertraline (ZOLOFT) 100 MG tablet Take 100 mg by mouth daily.    [provider]  simvastatin (ZOCOR) 40 MG tablet Take 40 mg by mouth at bedtime.     [provider]  spironolactone (ALDACTONE) 25 MG tablet Take 25 mg by mouth 2 (two) times daily.     [provider]  traZODone (DESYREL) 100 MG tablet Take 200 mg by mouth at bedtime.    [provider]    Family History History reviewed. No pertinent family history.  Social History Social History   Tobacco Use   Smoking status: Former    Packs/day: 0.50    Years: 47.00    Total pack years: 23.50    Types: Cigarettes   Smokeless tobacco: Never   Tobacco comments:    quit smoking in ~ 2012  Vaping Use   Vaping Use: Never used  Substance Use Topics   Alcohol use: No   Drug use: No     Allergies   Iodine-131, Ivp dye [iodinated contrast media], Sulfa antibiotics, Sulfamethoxazole, Clams [shellfish allergy], [place] clam (m. mercenaria) skin test, and Phenobarbital   Review of Systems Review of Systems: negative unless otherwise stated in HPI.      Physical Exam Triage Vital Signs ED Triage Vitals  Enc Vitals Group     BP 08/21/22 1312 (!) 148/88     Pulse Rate 08/21/22 1312 (!) 58     Resp 08/21/22 1312 16     Temp 08/21/22 1312 97.8 F (36.6 C)     Temp src --      SpO2 08/21/22 1312 98 %     Weight --      Height --      Head Circumference --      Peak Flow --      Pain Score 08/21/22 1313 2     Pain Loc --      Pain Edu? --      Excl. in [place]? --    No data found.  Updated Vital Signs BP (!) 148/88 (BP Location: Left Arm)   Pulse (!) 58   Temp 97.8 F (36.6 C)   Resp 16   SpO2 98%   Visual Acuity Right Eye Distance:   Left Eye Distance:   Bilateral Distance:    Right Eye Near:   Left Eye Near:    Bilateral Near:     Physical Exam GEN:     alert, non-toxic appearing female in  no distress    HENT:  mucus membranes moist, oropharyngeal without lesions or exudate, no tonsillar hypertrophy, mild oropharyngeal erythema,  moderate erythematous edematous turbinates,  clear nasal discharge, maxillary sinus tenderness EYES:   pupils equal and reactive, no scleral injection or discharge NECK:  no lymphadenopathy, no meningismus   RESP:  no increased work of breathing, clear to auscultation bilaterally CVS:   regular rate and rhythm Skin:   warm and dry    UC Treatments / Results  Labs (all labs ordered are listed, but only abnormal results are displayed) Labs Reviewed  RESP PANEL BY RT-PCR (RSV, FLU A&B, COVID)  [id number]    EKG   Radiology No results found.  Procedures Procedures (including critical care time)  Medications Ordered in UC Medications - No data to display  Initial Impression / Assessment and Plan / UC Course  I have reviewed the triage vital signs and the nursing notes.  Pertinent labs & imaging results that were available during my care of the patient were reviewed by me and considered in my medical decision making (see chart for details).       Pt is a 76 y.o. female who presents for 3 days of respiratory symptoms. Mary Vega is afebrile here without recent antipyretics. Satting well on room air. Overall pt is non-toxic appearing, well hydrated, without respiratory distress. Pulmonary exam is unremarkable.  COVID and influenza testing obtained.  I will call patient with test results, if positive. History consistent with viral respiratory illness. Discussed symptomatic treatment.  Explained lack of efficacy of antibiotics in viral disease.  Typical duration of symptoms discussed.   COVID, influenza and RSV are all negative.  Discussed that her symptoms are most likely viral.  Given that she was exposed to her grandson who had some bacterial infection.  We discussed a delayed prescription for antibiotics.  Pt to call if symptoms are not improving in 1  week for prescription.   Return and ED precautions given and voiced understanding. Discussed MDM, treatment plan and plan for follow-up with patient who agrees with plan.     Final Clinical Impressions(s) / UC Diagnoses   Final diagnoses:  Sinobronchitis     Discharge Instructions      We will contact you if your RSV/COVID/influenza test is positive. If your were prescribed medication, stop by the pharmacy to pick them up.   You can take Tylenol and/or Ibuprofen as needed for fever reduction and pain relief.    For cough: honey 1/2 to 1 teaspoon (you can dilute the honey in water or another fluid).  You can also use guaifenesin and dextromethorphan for cough. You can use a humidifier for chest congestion and cough.  If you don't have a humidifier, you can sit in the bathroom with the hot shower running.      For sore throat: try warm salt water gargles, Mucinex sore throat cough drops or cepacol lozenges, throat spray, warm tea or water with lemon/honey, popsicles or ice, or OTC cold relief medicine for throat discomfort. You can also purchase chloraseptic spray at the pharmacy or dollar store.   For congestion: take a daily anti-histamine like Zyrtec, Claritin, and a oral decongestant, such as pseudoephedrine.  You can also use Flonase 1-2 sprays in each nostril daily. Afrin is also a good option, if you do not have high blood pressure.    It is important to stay hydrated: drink plenty of fluids (water, gatorade/powerade/pedialyte, juices, or teas) to keep your throat moisturized and help further relieve irritation/discomfort.    Return or go to the Emergency Department if symptoms worsen or do not improve in the next few days  ED Prescriptions     Medication Sig Dispense Auth. Provider   promethazine-dextromethorphan (PROMETHAZINE-DM) 6.25-15 MG/5ML syrup Take 5 mLs by mouth 4 (four) times daily as needed. 118 mL Lyndee Hensen, DO      PDMP not reviewed this  encounter.   Lyndee Hensen, DO 08/21/22 1451

## 2022-08-21 NOTE — ED Triage Notes (Signed)
Patient presents to UC for cough, sore throat, and nasal congestion x 2 days. Not taking any medications for symptom relief.

## 2023-02-05 ENCOUNTER — Telehealth: Payer: Self-pay

## 2023-02-05 NOTE — Telephone Encounter (Signed)
Return call to pt.  She reports burning and pain with urination since yesterday afternoon.  Denies fevers.  Offered pt first available appt 02/08/2023 with Hilton Sinclair, PA-C @1100 .  Pt states she doesn't want to wait that long to be seen and declined appt.  States she will go to urgent care.

## 2023-02-05 NOTE — Telephone Encounter (Signed)
1400 Pt LM on triage line.  States she has a urinary tract infection.

## 2023-07-15 ENCOUNTER — Ambulatory Visit
Admission: RE | Admit: 2023-07-15 | Discharge: 2023-07-15 | Disposition: A | Discharge status: Home / Self Care | Payer: Medicare Other | Source: Ambulatory Visit | Attending: Physician Assistant | Admitting: Physician Assistant

## 2023-07-15 DIAGNOSIS — N281 Cyst of kidney, acquired: Secondary | ICD-10-CM | POA: Insufficient documentation

## 2023-07-18 ENCOUNTER — Ambulatory Visit: Payer: Medicare Other | Admitting: Physician Assistant

## 2023-07-18 VITALS — BP 134/73 | HR 79 | Ht 64.0 in | Wt 180.2 lb

## 2023-07-18 DIAGNOSIS — N281 Cyst of kidney, acquired: Secondary | ICD-10-CM

## 2023-07-18 DIAGNOSIS — R300 Dysuria: Secondary | ICD-10-CM | POA: Diagnosis not present

## 2023-07-18 DIAGNOSIS — R399 Unspecified symptoms and signs involving the genitourinary system: Secondary | ICD-10-CM | POA: Diagnosis not present

## 2023-07-18 LAB — URINALYSIS, COMPLETE
Bilirubin, UA: NEGATIVE
Glucose, UA: NEGATIVE
Ketones, UA: NEGATIVE
Leukocytes,UA: NEGATIVE
Nitrite, UA: NEGATIVE
Protein,UA: NEGATIVE
RBC, UA: NEGATIVE
Specific Gravity, UA: 1.025 (ref 1.005–1.030)
Urobilinogen, Ur: 0.2 mg/dL (ref 0.2–1.0)
pH, UA: 5.5 (ref 5.0–7.5)

## 2023-07-18 LAB — MICROSCOPIC EXAMINATION: Epithelial Cells (non renal): >10 /[HPF] — AB (ref 0–10)

## 2023-07-18 LAB — BLADDER SCAN AMB NON-IMAGING: Scan Result: 40

## 2023-07-18 NOTE — Progress Notes (Signed)
07/18/2023 2:18 PM   [name] May 23, 1946 [id number]  CC: Chief Complaint  Patient presents with   renal cyst follow up   HPI: Mary Vega is a 77 y.o. female with PMH recurrent UTI, right renal cyst, LUTS, CKD, and ovarian cancer s/p chemo who presents today for annual follow-up.   Today she reports occasional labial burning/irritation with voiding. She has also noticed intermittent hesitancy, intermittency, and weak stream.  Renal ultrasound was performed on 07/15/2023 and radiology read is pending.  On personal review, this appears stable with 2 Bosniak 1 cysts of the right kidney.  No hydronephrosis appreciated.  She is hesitant to try topical vaginal estrogen cream due to her ovarian cancer history. She reports 2 UTIs in the past year; she tried to make appointments with [place] for these but was unable to be seen quickly so she went to urgent care. She is frustrated that we have been unable to see her more timely given her history of pyelonephritis.  Per chart review, two positive urine cultures from this year grew E coli.  In-office UA today pan-negative; urine microscopy with >10 epithelial cells/HPF and moderate bacteria. PVR 40mL.  PMH: Past Medical History:  Diagnosis Date   Anxiety    takes Lorazepam daily   Chronic lower back pain    buldging disc   Constipation    takes Stool Softener daily as needed   Depression    takes Zoloft daily   GERD (gastroesophageal reflux disease)    takes Omeprazole daily   History of blood transfusion    "when I had cancer surgery; hysterectomy"   History of bronchitis    Sept 2016   History of colon polyps    benign   Hyperlipidemia    takes Simvastatin daily   Hypertension    takes Losartan daily    Insomnia    takes Trazodone nightly   Kidney stone    "must have passed it"   OSA on CPAP    Ovarian cancer (HCC)    Peripheral edema    takes Spironolactone daily   Peripheral neuropathy    Pneumonia ~ 2012    PONV (postoperative nausea and vomiting)    Sinus headache    Urinary frequency    UTI (lower urinary tract infection)    being treated with Macrobid,completed today 11/11/15    Surgical History: Past Surgical History:  Procedure Laterality Date   ABDOMINAL HYSTERECTOMY     CATARACT EXTRACTION W/ INTRAOCULAR LENS  IMPLANT, BILATERAL Bilateral    COLONOSCOPY     DIAGNOSTIC LAPAROSCOPY     to drain cyst on ovary   THYROID LOBECTOMY Right 11/17/2015   THYROID SURGERY  X 2   goiter removed   THYROIDECTOMY Right 11/17/2015   Procedure: RIGHT THYROID LOBECTOMY WITH FROZEN SECTION;  Surgeon: Suzanna Obey, MD;  Location: [facility] OR;  Service: ENT;  Laterality: Right;    Home Medications:  Allergies as of 07/18/2023       Reactions   Iodine-131 Anaphylaxis, Itching   Ivp Dye [iodinated Contrast Media] Anaphylaxis   Sulfa Antibiotics Hives, Other (See Comments)   Septic?   Sulfamethoxazole Hives, Rash   Clams [shellfish Allergy] Diarrhea, Nausea And Vomiting   [place] Clam (m. Mercenaria) Skin Test Diarrhea, Nausea And Vomiting   Phenobarbital Other (See Comments)   Tremor (intolerance)        Medication List        Accurate as of July 18, 2023  2:18  PM. If you have any questions, ask your nurse or doctor.          STOP taking these medications    ipratropium 0.06 % nasal spray Commonly known as: ATROVENT   promethazine-dextromethorphan 6.25-15 MG/5ML syrup Commonly known as: PROMETHAZINE-DM       TAKE these medications    albuterol 108 (90 Base) MCG/ACT inhaler Commonly known as: VENTOLIN HFA Inhale into the lungs.   amLODipine 2.5 MG tablet Commonly known as: NORVASC Take 2.5 mg by mouth daily.   LORazepam 1 MG tablet Commonly known as: ATIVAN Take 1 mg by mouth every 6 (six) hours as needed for anxiety.   multivitamin with minerals Tabs tablet Take 1 tablet by mouth daily.   pantoprazole 40 MG tablet Commonly known as: PROTONIX Take 40 mg by  mouth daily.   rosuvastatin 10 MG tablet Commonly known as: CRESTOR Take 10 mg by mouth daily.   sertraline 100 MG tablet Commonly known as: ZOLOFT Take 100 mg by mouth daily.   simvastatin 40 MG tablet Commonly known as: ZOCOR Take 40 mg by mouth at bedtime.   spironolactone 25 MG tablet Commonly known as: ALDACTONE Take 25 mg by mouth 2 (two) times daily.   traZODone 100 MG tablet Commonly known as: DESYREL Take 200 mg by mouth at bedtime.        Allergies:  Allergies  Allergen Reactions   Iodine-131 Anaphylaxis and Itching   Ivp Dye [Iodinated Contrast Media] Anaphylaxis   Sulfa Antibiotics Hives and Other (See Comments)    Septic?   Sulfamethoxazole Hives and Rash   Clams [Shellfish Allergy] Diarrhea and Nausea And Vomiting   [place] Clam (M. Mercenaria) Skin Test Diarrhea and Nausea And Vomiting   Phenobarbital Other (See Comments)    Tremor (intolerance)    Family History: No family history on file.  Social History:   reports that she has quit smoking. Her smoking use included cigarettes. She has a 23.5 pack-year smoking history. She has never used smokeless tobacco. She reports that she does not drink alcohol and does not use drugs.  Physical Exam: BP 134/73   Pulse 79   Ht 5\' 4"  (1.626 m)   Wt 180 lb 4 oz (81.8 kg)   BMI 30.94 kg/m   Constitutional:  Alert and oriented, no acute distress, nontoxic appearing HEENT: [place], AT Cardiovascular: No clubbing, cyanosis, or edema Respiratory: Normal respiratory effort, no increased work of breathing Skin: No rashes, bruises or suspicious lesions Neurologic: Grossly intact, no focal deficits, moving all 4 extremities Psychiatric: Normal mood and affect  Laboratory Data: Results for orders placed or performed in visit on 07/18/23  Microscopic Examination   Urine  Result Value Ref Range   WBC, UA 0-5 0 - 5 /hpf   RBC, Urine 0-2 0 - 2 /hpf   Epithelial Cells (non renal) >10 (A) 0 - 10 /hpf   Mucus,  UA Present (A) Not Estab.   Bacteria, UA Moderate (A) None seen/Few  Urinalysis, Complete  Result Value Ref Range   Specific Gravity, UA 1.025 1.005 - 1.030   pH, UA 5.5 5.0 - 7.5   Color, UA Yellow Yellow   Appearance Ur Clear Clear   Leukocytes,UA Negative Negative   Protein,UA Negative Negative/Trace   Glucose, UA Negative Negative   Ketones, UA Negative Negative   RBC, UA Negative Negative   Bilirubin, UA Negative Negative   Urobilinogen, Ur 0.2 0.2 - 1.0 mg/dL   Nitrite, UA Negative Negative  Microscopic Examination See below:   BLADDER SCAN AMB NON-IMAGING  Result Value Ref Range   Scan Result 40 ml    Pertinent Imaging: RUS, 07/15/2023: See Epic  I personally reviewed the images referenced above and note stable Bosniak I renal cysts without hydronephrosis.  Assessment & Plan:   1. Renal cyst Stable Bosniak I renal cysts on RUS per personal review; will share official radiology report when available.  She prefers annual monitoring of these due to her ovarian cancer history of a family history of PCKD in her father.  2. Dysuria UA bland today, though contaminated. Low concern for UTI. I think her burning/irritation is secondary to GSM. We discussed safety profile of topical vaginal estrogen cream, though since she is hesitant to pursue this we also discussed OTC nonhormonal vaginal moisturizers for relief instead. Info given in her AVS today.  With regard to her rUTIs, I apologized that we have not had availability to see her timely for her symptoms this year. We are actively working to remedy this. I asked her to continue attempting to make appointments with [place] when symptoms occur. - Urinalysis, Complete  3. Lower urinary tract symptoms (LUTS) Intermittent obstructive LUTS including hesitancy, intermittency, and weak stream. Fortunately she is emptying appropriately. We discussed a trial of Flomax versus cysto for evaluation of possible stricture, though admittedly I  think this is unlikely since she did not complete RT for her ovarian cancer. She prefers cysto in the new year; will schedule. - BLADDER SCAN AMB NON-IMAGING   Return in about 2 months (around 09/17/2023) for Cysto with Dr. Richardo Hanks.  Carman Ching, PA-C  [facility] [place] [address] [place], [place] [address] [phone]

## 2023-07-18 NOTE — Patient Instructions (Addendum)
For your occasional burning, you may try the over-the-counter vaginal moisturizer called Replens.  Cystoscopy Cystoscopy is a procedure that is used to help diagnose and sometimes treat conditions that affect the lower urinary tract. The lower urinary tract includes the bladder and the urethra. The urethra is the tube that drains urine from the bladder. Cystoscopy is done using a thin, tube-shaped instrument with a light and camera at the end (cystoscope). The cystoscope may be hard or flexible, depending on the goal of the procedure. The cystoscope is inserted through the urethra, into the bladder. Cystoscopy may be recommended if you have: Urinary tract infections that keep coming back. Blood in the urine (hematuria). An inability to control when you urinate (urinary incontinence) or an overactive bladder. Unusual cells found in a urine sample. A blockage in the urethra, such as a urinary stone. Painful urination. An abnormality in the bladder found during an intravenous pyelogram (IVP) or CT scan. What are the risks? Generally, this is a safe procedure. However, problems may occur, including: Infection. Bleeding.  What happens during the procedure?  You will be given one or more of the following: A medicine to numb the area (local anesthetic). The area around the opening of your urethra will be cleaned. The cystoscope will be passed through your urethra into your bladder. Germ-free (sterile) fluid will flow through the cystoscope to fill your bladder. The fluid will stretch your bladder so that your health care provider can clearly examine your bladder walls. Your doctor will look at the urethra and bladder. The cystoscope will be removed The procedure may vary among health care providers  What can I expect after the procedure? After the procedure, it is common to have: Some soreness or pain in your urethra. Urinary symptoms. These include: Mild pain or burning when you urinate.  Pain should stop within a few minutes after you urinate. This may last for up to a few days after the procedure. A small amount of blood in your urine for several days. Feeling like you need to urinate but producing only a small amount of urine. Follow these instructions at home: General instructions Return to your normal activities as told by your health care provider.  Drink plenty of fluids after the procedure. Keep all follow-up visits as told by your health care provider. This is important. Contact a health care provider if you: Have pain that gets worse or does not get better with medicine, especially pain when you urinate lasting longer than 72 hours after the procedure. Have trouble urinating. Get help right away if you: Have blood clots in your urine. Have a fever or chills. Are unable to urinate. Summary Cystoscopy is a procedure that is used to help diagnose and sometimes treat conditions that affect the lower urinary tract. Cystoscopy is done using a thin, tube-shaped instrument with a light and camera at the end. After the procedure, it is common to have some soreness or pain in your urethra. It is normal to have blood in your urine after the procedure.  If you were prescribed an antibiotic medicine, take it as told by your health care provider.  This information is not intended to replace advice given to you by your health care provider. Make sure you discuss any questions you have with your health care provider. Document Revised: 08/12/2018 Document Reviewed: 08/12/2018 Elsevier Patient Education  2020 [employer].

## 2023-09-19 ENCOUNTER — Ambulatory Visit: Payer: Medicare Other | Admitting: Urology

## 2023-09-19 DIAGNOSIS — R3916 Straining to void: Secondary | ICD-10-CM

## 2023-09-19 DIAGNOSIS — R300 Dysuria: Secondary | ICD-10-CM

## 2023-09-19 DIAGNOSIS — Z87440 Personal history of urinary (tract) infections: Secondary | ICD-10-CM | POA: Diagnosis not present

## 2023-09-19 DIAGNOSIS — N390 Urinary tract infection, site not specified: Secondary | ICD-10-CM

## 2023-09-19 MED ORDER — NITROFURANTOIN MONOHYD MACRO 100 MG PO CAPS
100.0000 mg | ORAL_CAPSULE | Freq: Once | ORAL | Status: AC
  Administered 2023-09-19: 100 mg via ORAL

## 2023-09-19 MED ORDER — LIDOCAINE HCL URETHRAL/MUCOSAL 2 % EX GEL
1.0000 | Freq: Once | CUTANEOUS | Status: AC
  Administered 2023-09-19: 1 via URETHRAL

## 2023-09-19 NOTE — Progress Notes (Signed)
Cystoscopy Procedure Note:  Indication: Dysuria, straining with urination  History of recurrent UTI, deferred topical estrogen cream with history of ovarian cancer.    Nitrofurantoin given for prophylaxis  After informed consent and discussion of the procedure and its risks, Mary Vega was positioned and prepped in the standard fashion. Cystoscopy was performed with a flexible cystoscope. The urethra, bladder neck and entire bladder was visualized in a standard fashion. The ureteral orifices were visualized in their normal location and orientation.  Bladder mucosa grossly normal throughout, no abnormalities on retroflexion.  Careful inspection of the urethra on pullback cystoscopy benign.  Imaging: Renal ultrasound November 2024 with no bladder abnormalities, benign renal cysts, she prefers to continue yearly surveillance for her benign renal lesions.  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

## 2023-09-19 NOTE — Patient Instructions (Addendum)
 Marland Kitchen

## 2023-11-18 ENCOUNTER — Ambulatory Visit: Payer: Medicare Other | Admitting: Physician Assistant

## 2023-11-18 ENCOUNTER — Encounter: Payer: Self-pay | Admitting: Physician Assistant

## 2023-11-18 VITALS — BP 143/78 | HR 76 | Ht 65.0 in | Wt 181.6 lb

## 2023-11-18 DIAGNOSIS — N281 Cyst of kidney, acquired: Secondary | ICD-10-CM | POA: Diagnosis not present

## 2023-11-18 DIAGNOSIS — R3912 Poor urinary stream: Secondary | ICD-10-CM | POA: Diagnosis not present

## 2023-11-18 NOTE — Progress Notes (Signed)
 11/18/2023 3:20 PM   Mary Vega 12-30-1945 [id number]  CC: Chief Complaint  Patient presents with   Follow-up   HPI: Mary Vega is a 78 y.o. female with PMH recurrent UTI, right renal cyst, LUTS including hesitancy and weak stream, CKD, and ovarian cancer s/p chemo who presents today for follow-up after normal cystoscopy with Dr. Richardo Hanks 2 months ago.   Today she reports continued intermittent hesitancy and weak stream.  Her husband recently had shoulder surgery and they are going through PT for this.  She is hesitant to start another therapy that would add to her schedule.  PMH: Past Medical History:  Diagnosis Date   Anxiety    takes Lorazepam daily   Chronic lower back pain    buldging disc   Constipation    takes Stool Softener daily as needed   Depression    takes Zoloft daily   GERD (gastroesophageal reflux disease)    takes Omeprazole daily   History of blood transfusion    "when I had cancer surgery; hysterectomy"   History of bronchitis    Sept 2016   History of colon polyps    benign   Hyperlipidemia    takes Simvastatin daily   Hypertension    takes Losartan daily    Insomnia    takes Trazodone nightly   Kidney stone    "must have passed it"   OSA on CPAP    Ovarian cancer (HCC)    Peripheral edema    takes Spironolactone daily   Peripheral neuropathy    Pneumonia ~ 2012   PONV (postoperative nausea and vomiting)    Sinus headache    Urinary frequency    UTI (lower urinary tract infection)    being treated with Macrobid,completed today 11/11/15    Surgical History: Past Surgical History:  Procedure Laterality Date   ABDOMINAL HYSTERECTOMY     CATARACT EXTRACTION W/ INTRAOCULAR LENS  IMPLANT, BILATERAL Bilateral    COLONOSCOPY     DIAGNOSTIC LAPAROSCOPY     to drain cyst on ovary   THYROID LOBECTOMY Right 11/17/2015   THYROID SURGERY  X 2   goiter removed   THYROIDECTOMY Right 11/17/2015   Procedure: RIGHT THYROID LOBECTOMY WITH  FROZEN SECTION;  Surgeon: Suzanna Obey, MD;  Location[facility] OR;  Service: ENT;  Laterality: Right;    Home Medications:  Allergies as of 11/18/2023       Reactions   Iodine-131 Anaphylaxis, Itching   Ivp Dye [iodinated Contrast Media] Anaphylaxis   Sulfa Antibiotics Hives, Other (See Comments)   Septic?   Sulfamethoxazole Hives, Rash   Clams [shellfish Allergy] Diarrhea, Nausea And Vomiting   [employer], [employer] (m. Mercenaria) Skin Test Diarrhea, Nausea And Vomiting   Phenobarbital Other (See Comments)   Tremor (intolerance)        Medication List        Accurate as of November 18, 2023  3:20 PM. If you have any questions, ask your nurse or doctor.          albuterol 108 (90 Base) MCG/ACT inhaler Commonly known as: VENTOLIN HFA Inhale into the lungs.   amLODipine 2.5 MG tablet Commonly known as: NORVASC Take 2.5 mg by mouth daily.   fexofenadine 180 MG tablet Commonly known as: ALLEGRA Take 180 mg by mouth daily.   Flonase Allergy Relief 50 MCG/ACT nasal spray Generic drug: fluticasone Place into both nostrils daily.   LORazepam 1 MG tablet Commonly known as: ATIVAN Take 1  mg by mouth every 6 (six) hours as needed for anxiety.   multivitamin with minerals Tabs tablet Take 1 tablet by mouth daily.   pantoprazole 40 MG tablet Commonly known as: PROTONIX Take 40 mg by mouth daily.   rosuvastatin 10 MG tablet Commonly known as: CRESTOR Take 10 mg by mouth daily.   sertraline 100 MG tablet Commonly known as: ZOLOFT Take 100 mg by mouth daily.   simvastatin 40 MG tablet Commonly known as: ZOCOR Take 40 mg by mouth at bedtime.   spironolactone 25 MG tablet Commonly known as: ALDACTONE Take 25 mg by mouth 2 (two) times daily.   traZODone 100 MG tablet Commonly known as: DESYREL Take 200 mg by mouth at bedtime.        Allergies:  Allergies  Allergen Reactions   Iodine-131 Anaphylaxis and Itching   Ivp Dye [Iodinated Contrast Media] Anaphylaxis    Sulfa Antibiotics Hives and Other (See Comments)    Septic?   Sulfamethoxazole Hives and Rash   Clams [Shellfish Allergy] Diarrhea and Nausea And Vomiting   [place] Clam (M. Mercenaria) Skin Test Diarrhea and Nausea And Vomiting   Phenobarbital Other (See Comments)    Tremor (intolerance)    Family History: No family history on file.  Social History:   reports that she has quit smoking. Her smoking use included cigarettes. She has a 23.5 pack-year smoking history. She has never used smokeless tobacco. She reports that she does not drink alcohol and does not use drugs.  Physical Exam: BP (!) 143/78   Pulse 76   Ht 5\' 5"  (1.651 m)   Wt 181 lb 9.6 oz (82.4 kg)   BMI 30.22 kg/m   Constitutional:  Alert and oriented, no acute distress, nontoxic appearing HEENT: [place], AT Cardiovascular: No clubbing, cyanosis, or edema Respiratory: Normal respiratory effort, no increased work of breathing Skin: No rashes, bruises or suspicious lesions Neurologic: Grossly intact, no focal deficits, moving all 4 extremities Psychiatric: Normal mood and affect  Assessment & Plan:   1. Weak urinary stream (Primary) We discussed Flomax versus pelvic floor PT for her intermittent obstructive LUTS.  She prefers to defer these for now in light of her husband's recovery.  May revisit in the future as desired.  2. Renal cyst Will continue annual renal ultrasounds per patient preference. - US RENAL; Future  Return in about 1 year (around 11/17/2024) for Annual f/u with RUS prior.  Carman Ching, PA-C  [facility] [place] [address], [address] [place], [place] [address] [phone]

## 2024-11-10 ENCOUNTER — Other Ambulatory Visit

## 2024-11-17 ENCOUNTER — Ambulatory Visit: Admitting: Physician Assistant
